# Patient Record
Sex: Male | Born: 1980 | Race: White | Hispanic: No | Marital: Married | Smoking: Never smoker
Health system: Southern US, Community
[De-identification: ages and names within clinical notes are randomized; demographics above are authoritative.]

## PROBLEM LIST (undated history)

## (undated) DIAGNOSIS — K222 Esophageal obstruction: Secondary | ICD-10-CM

## (undated) DIAGNOSIS — Z8619 Personal history of other infectious and parasitic diseases: Secondary | ICD-10-CM

## (undated) DIAGNOSIS — K219 Gastro-esophageal reflux disease without esophagitis: Secondary | ICD-10-CM

## (undated) HISTORY — DX: Gastro-esophageal reflux disease without esophagitis: K21.9

## (undated) HISTORY — DX: Personal history of other infectious and parasitic diseases: Z86.19

## (undated) HISTORY — DX: Esophageal obstruction: K22.2

## (undated) HISTORY — PX: VASECTOMY: SHX75

## (undated) HISTORY — PX: HERNIA REPAIR: SHX51

---

## 2001-03-09 HISTORY — PX: ESOPHAGEAL DILATION: SHX303

## 2008-03-14 ENCOUNTER — Ambulatory Visit: Payer: Self-pay | Admitting: Gastroenterology

## 2010-10-12 ENCOUNTER — Ambulatory Visit: Payer: Self-pay | Admitting: Internal Medicine

## 2011-01-14 ENCOUNTER — Ambulatory Visit: Payer: Self-pay

## 2011-01-16 ENCOUNTER — Ambulatory Visit: Payer: Self-pay

## 2011-08-09 ENCOUNTER — Ambulatory Visit: Payer: Self-pay

## 2011-08-09 LAB — CBC WITH DIFFERENTIAL/PLATELET
Basophil #: 0 10*3/uL (ref 0.0–0.1)
Basophil %: 0.2 %
Eosinophil #: 0 10*3/uL (ref 0.0–0.7)
Eosinophil %: 0.3 %
HGB: 16 g/dL (ref 13.0–18.0)
Lymphocyte #: 0.6 10*3/uL — ABNORMAL LOW (ref 1.0–3.6)
Lymphocyte %: 6.8 %
MCHC: 33.3 g/dL (ref 32.0–36.0)
MCV: 87 fL (ref 80–100)
Monocyte #: 0.7 x10 3/mm (ref 0.2–1.0)
Monocyte %: 7.8 %
Neutrophil %: 84.9 %
RBC: 5.51 10*6/uL (ref 4.40–5.90)

## 2011-08-09 LAB — RAPID STREP-A WITH REFLX: Micro Text Report: NEGATIVE

## 2011-08-09 LAB — MONONUCLEOSIS SCREEN: Mono Test: NEGATIVE

## 2011-08-11 LAB — BETA STREP CULTURE(ARMC)

## 2012-04-28 IMAGING — CT CT ABDOMEN AND PELVIS WITHOUT AND WITH CONTRAST
2 of 4 series · 14 of 32 positions shown, 19 images · non-contrast
Comparison: none

REASON FOR EXAM: right side abd pain with hematuria
COMMENTS:

[Series 5: soft tissue with · axial · 0.78mm/px · z∈[-476,-106]mm · 8 of 96 slices shown, 13 images]
[im 11/96  soft-tissue]
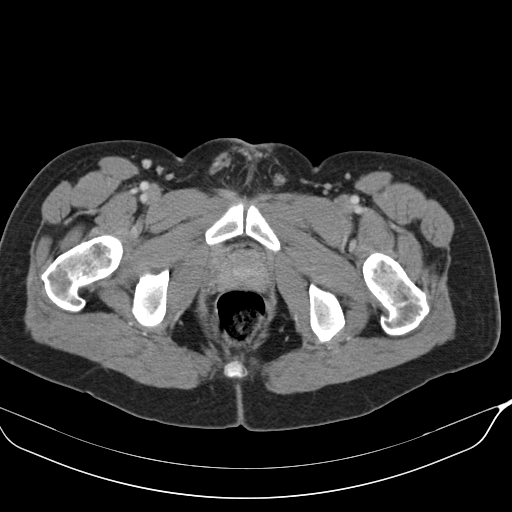
[im 11/96  bone]
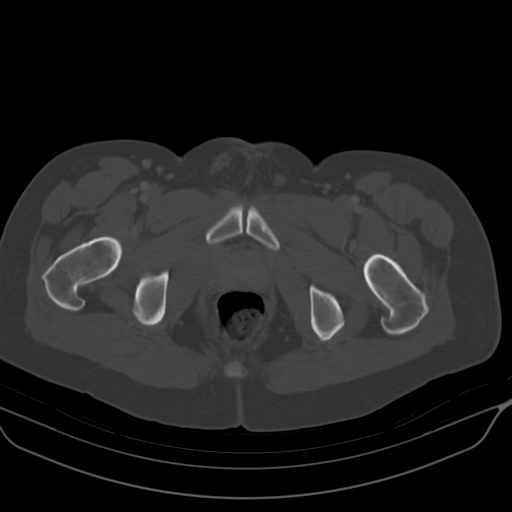
[im 22/96  soft-tissue]
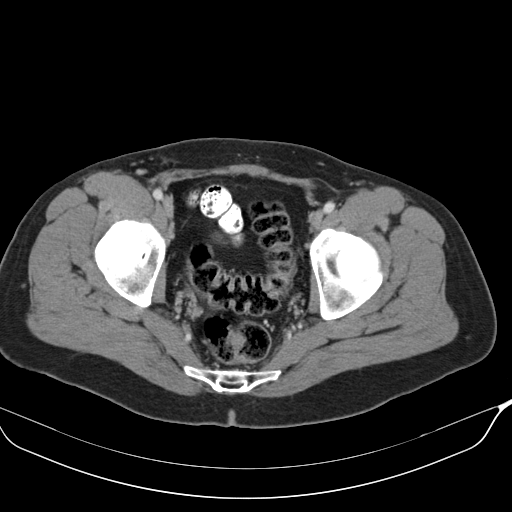
[im 32/96  soft-tissue]
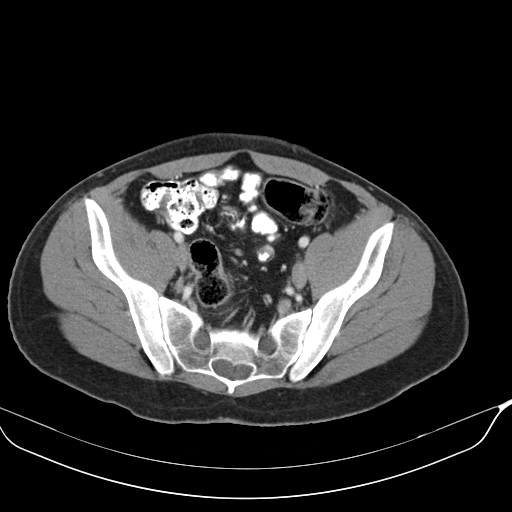
[im 43/96  soft-tissue]
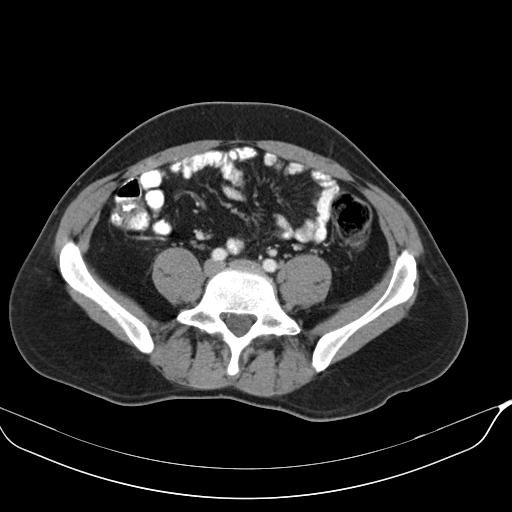
[im 53/96  soft-tissue]
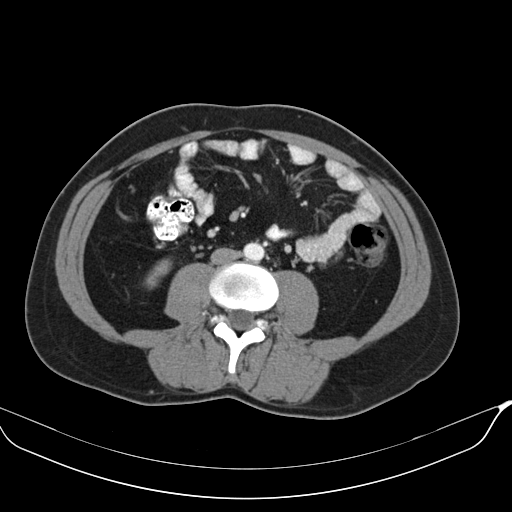
[im 53/96  lung]
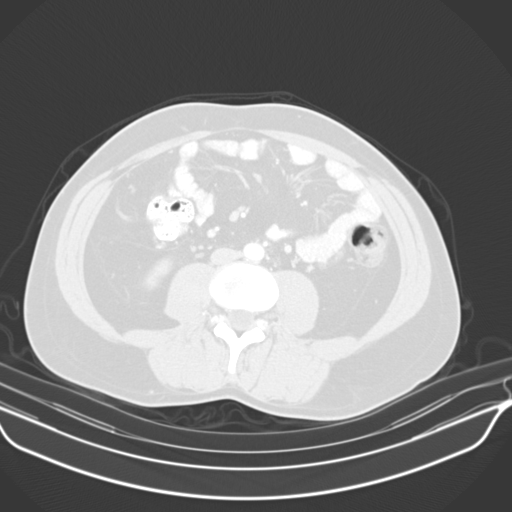
[im 64/96  soft-tissue]
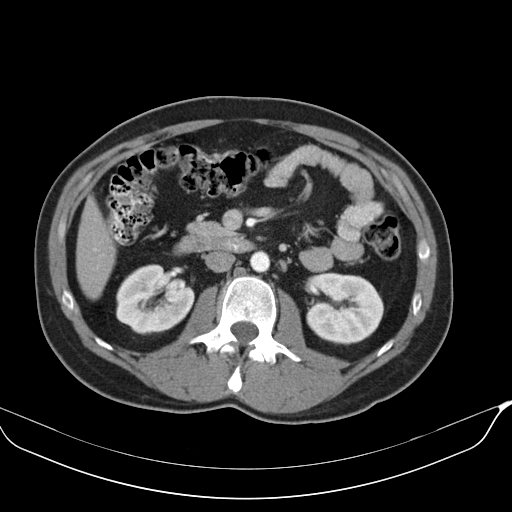
[im 64/96  lung]
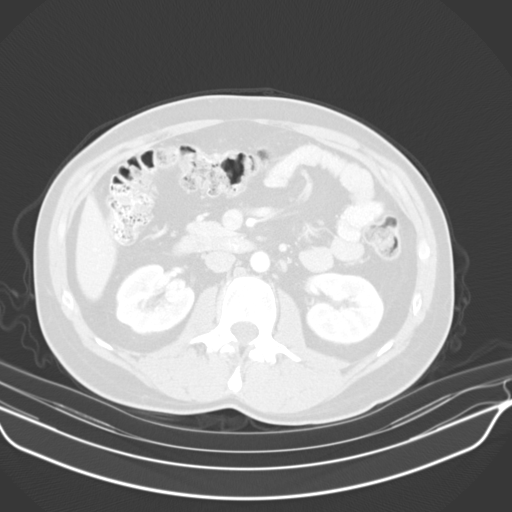
[im 74/96  soft-tissue]
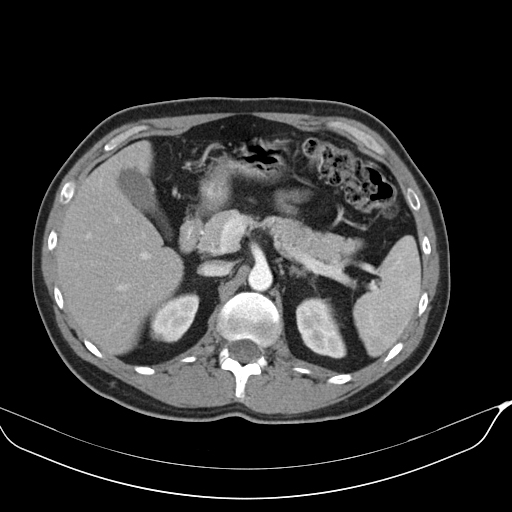
[im 74/96  lung]
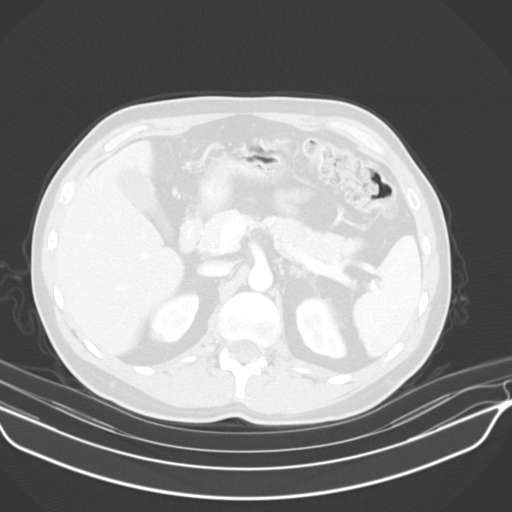
[im 85/96  soft-tissue]
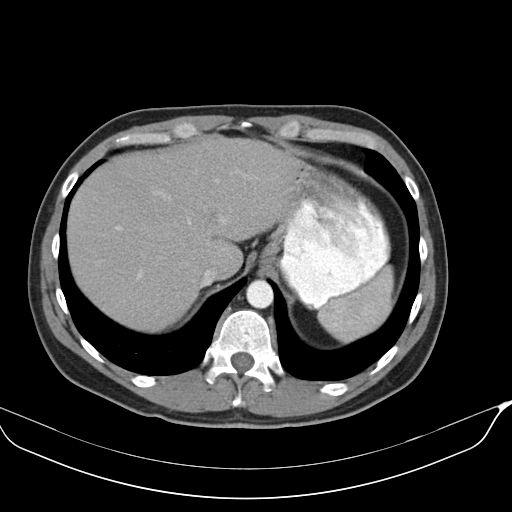
[im 85/96  lung]
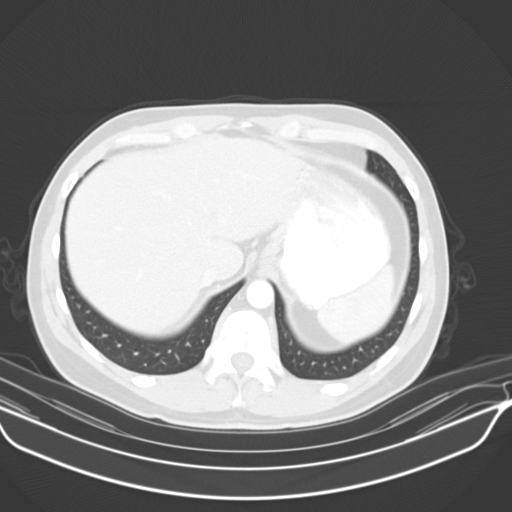

[Series 7: soft tissue delay · axial · delayed · 0.78mm/px · z∈[-476,-211]mm · 6 of 96 slices shown]
[im 11/96  soft-tissue]
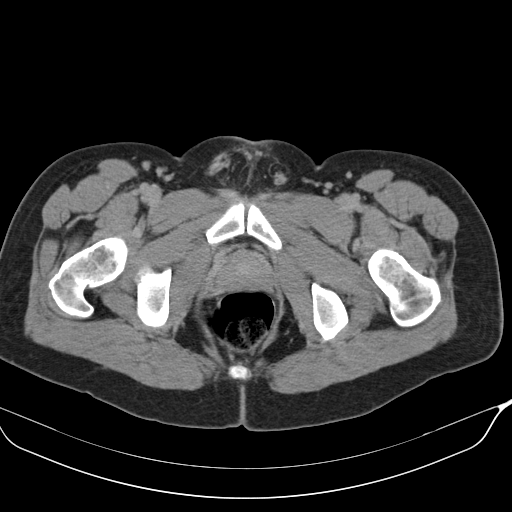
[im 22/96  soft-tissue]
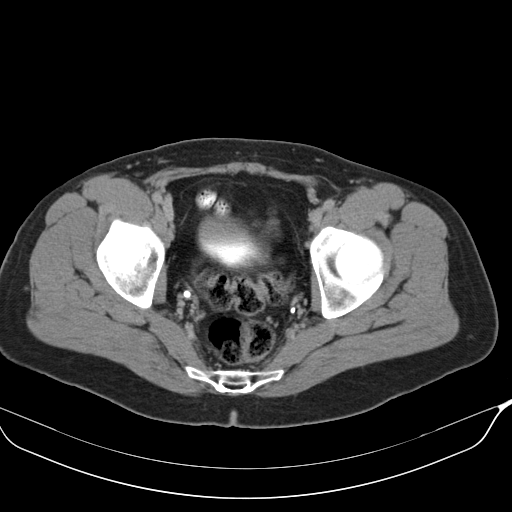
[im 32/96  soft-tissue]
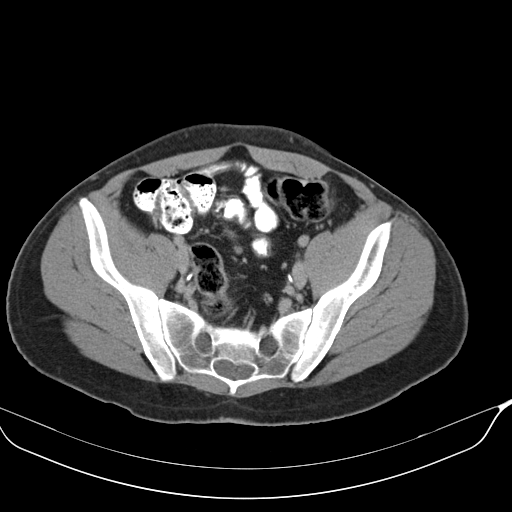
[im 43/96  soft-tissue]
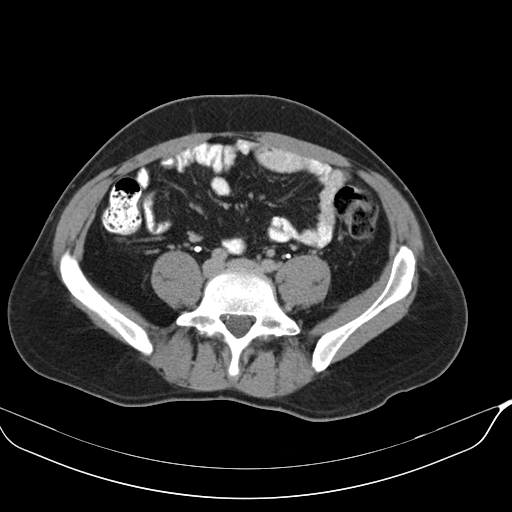
[im 53/96  soft-tissue]
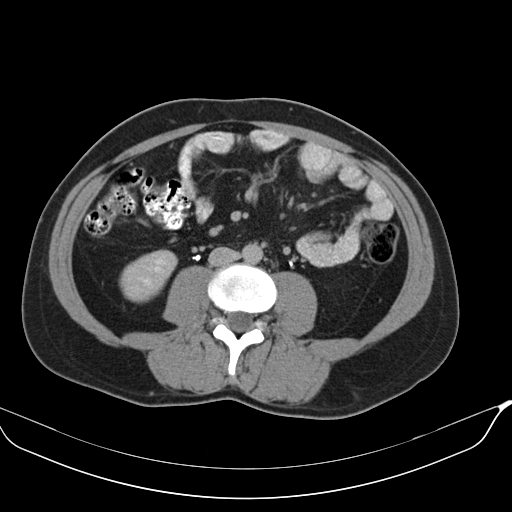
[im 64/96  soft-tissue]
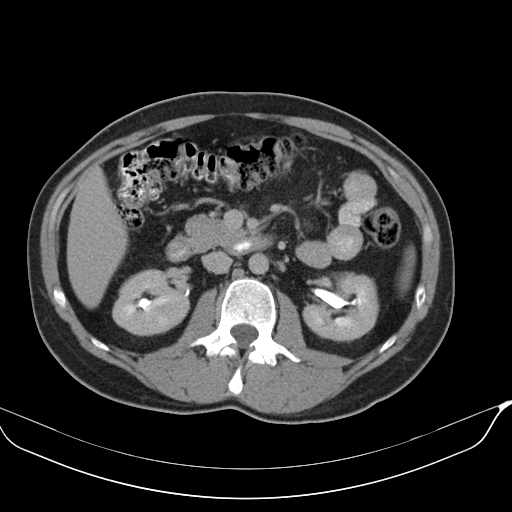

[14 of 32 positions shown; findings below may reference images not displayed]

PROCEDURE:     GUIZENI - GUIZENI ABDOMEN / PELVIS W/WO  - January 16, 2011 [DATE]

RESULT:     Emergent CT of the abdomen and pelvis is reconstructed right
mm slice thickness in the axial plane. Triphasic technique with 100 mL of
Ssovue-3TD iodinated intravenous contrast is utilized. There is no previous
similar study for comparison.

Precontrast images show no hydronephrosis or nephrolithiasis. No radiopaque
gallstones are evident. Following contrast there is no abnormal renal
enhancement. Post contrast delayed images demonstrate excretion of contrast
opacified urine by both kidneys into normal appearing ureters. The urinary
bladder shows no abnormality. The prostate is mildly enlarged. Correlate
with digital rectal exam and PSA. There is no adenopathy. The appendix is
unremarkable. No abnormal bowel distention or wall thickening is
appreciated. The pancreas, liver, spleen, gallbladder, kidneys, adrenal
glands and aorta appear unremarkable.
IMPRESSION: 1. No evidence of nephrolithiasis or ureterolithiasis. No focal renal mass
evident. Mild enlargement of the prostate. Otherwise, unremarkable exam. The
lung bases appear clear.

## 2016-04-01 ENCOUNTER — Ambulatory Visit: Payer: Self-pay | Admitting: Internal Medicine

## 2016-04-07 ENCOUNTER — Ambulatory Visit: Payer: Self-pay | Admitting: Internal Medicine

## 2016-04-16 ENCOUNTER — Ambulatory Visit (INDEPENDENT_AMBULATORY_CARE_PROVIDER_SITE_OTHER): Payer: Self-pay | Admitting: Internal Medicine

## 2016-04-16 ENCOUNTER — Encounter: Payer: Self-pay | Admitting: Internal Medicine

## 2016-04-16 VITALS — BP 124/76 | HR 72 | Temp 98.2°F | Ht 71.75 in | Wt 204.0 lb

## 2016-04-16 DIAGNOSIS — Z23 Encounter for immunization: Secondary | ICD-10-CM

## 2016-04-16 DIAGNOSIS — Z8719 Personal history of other diseases of the digestive system: Secondary | ICD-10-CM

## 2016-04-16 DIAGNOSIS — R030 Elevated blood-pressure reading, without diagnosis of hypertension: Secondary | ICD-10-CM

## 2016-04-16 DIAGNOSIS — K219 Gastro-esophageal reflux disease without esophagitis: Secondary | ICD-10-CM

## 2016-04-16 NOTE — Addendum Note (Signed)
Addended by: Roena MaladyEVONTENNO, Jasmeen Fritsch Y on: 04/16/2016 04:37 PM   Modules accepted: Orders

## 2016-04-16 NOTE — Progress Notes (Signed)
HPI  Pt presents to the clinic today to establish care. He has not had a PCP in many years.   Elevated Blood Pressure: He reports he has never been told he has high blood pressure in the past. His BP today is 130/82. He reports he was rushing to get here because he knew he was late. He denies headaches, dizziness, chest pain or shortness of breath.  GERD with Esophageal Stricture: He has not had any issues with this since his esophageal dilation in 2003.  Flu: 12/2014 Tetanus: 2005 Dentist: annually  Past Medical History:  Diagnosis Date  . History of chicken pox     No current outpatient prescriptions on file.   No current facility-administered medications for this visit.     No Known Allergies  Family History  Problem Relation Age of Onset  . Breast cancer Paternal Grandmother     Social History   Social History  . Marital status: Single    Spouse name: N/A  . Number of children: N/A  . Years of education: N/A   Occupational History  . Not on file.   Social History Main Topics  . Smoking status: Never Smoker  . Smokeless tobacco: Never Used  . Alcohol use Yes     Comment: occasional  . Drug use: No  . Sexual activity: Not on file   Other Topics Concern  . Not on file   Social History Narrative  . No narrative on file    ROS:  Constitutional: Denies fever, malaise, fatigue, headache or abrupt weight changes.  Respiratory: Denies difficulty breathing, shortness of breath, cough or sputum production.   Cardiovascular: Denies chest pain, chest tightness, palpitations or swelling in the hands or feet.  Gastrointestinal: Denies abdominal pain, bloating, constipation, diarrhea or blood in the stool.  Neurological: Denies dizziness, difficulty with memory, difficulty with speech or problems with balance and coordination.  Psych: Denies anxiety, depression, SI/HI.  No other specific complaints in a complete review of systems (except as listed in HPI  above).  PE:  BP 130/82   Pulse 72   Temp 98.2 F (36.8 C) (Oral)   Ht 5' 11.75" (1.822 m)   Wt 204 lb (92.5 kg)   SpO2 98%   BMI 27.86 kg/m  Wt Readings from Last 3 Encounters:  04/16/16 204 lb (92.5 kg)    General: Appears his stated age, well developed, well nourished in NAD. Cardiovascular: Normal rate and rhythm. S1,S2 noted.  No murmur, rubs or gallops noted.  Pulmonary/Chest: Normal effort and positive vesicular breath sounds. No respiratory distress. No wheezes, rales or ronchi noted.  Abdomen: Soft and nontender. Normal bowel sounds. No distention or masses noted. Neurological: Alert and oriented.  Psychiatric: Mood and affect normal. Behavior is normal. Judgment and thought content normal.   Assessment and Plan:  Elevated Blood Pressure:  Recheck 124/76 Will monitor for now  GERD:  Currently not an issue Will monitor  Make an appt for your annual exam Nicki ReaperBAITY, Jarquavious Fentress, NP

## 2016-04-16 NOTE — Patient Instructions (Signed)
Hypertension Hypertension is another name for high blood pressure. High blood pressure forces your heart to work harder to pump blood. A blood pressure reading has two numbers, which includes a higher number over a lower number (example: 110/72). Follow these instructions at home:  Have your blood pressure rechecked by your doctor.  Only take medicine as told by your doctor. Follow the directions carefully. The medicine does not work as well if you skip doses. Skipping doses also puts you at risk for problems.  Do not smoke.  Monitor your blood pressure at home as told by your doctor. Contact a doctor if:  You think you are having a reaction to the medicine you are taking.  You have repeat headaches or feel dizzy.  You have puffiness (swelling) in your ankles.  You have trouble with your vision. Get help right away if:  You get a very bad headache and are confused.  You feel weak, numb, or faint.  You get chest or belly (abdominal) pain.  You throw up (vomit).  You cannot breathe very well. This information is not intended to replace advice given to you by your health care provider. Make sure you discuss any questions you have with your health care provider. Document Released: 08/12/2007 Document Revised: 08/01/2015 Document Reviewed: 12/16/2012 Elsevier Interactive Patient Education  2017 Elsevier Inc.  

## 2016-05-14 ENCOUNTER — Ambulatory Visit (INDEPENDENT_AMBULATORY_CARE_PROVIDER_SITE_OTHER): Payer: Managed Care, Other (non HMO) | Admitting: Internal Medicine

## 2016-05-14 ENCOUNTER — Encounter: Payer: Self-pay | Admitting: Internal Medicine

## 2016-05-14 VITALS — BP 122/80 | HR 74 | Temp 98.2°F | Wt 203.0 lb

## 2016-05-14 DIAGNOSIS — G44209 Tension-type headache, unspecified, not intractable: Secondary | ICD-10-CM | POA: Diagnosis not present

## 2016-05-14 DIAGNOSIS — R030 Elevated blood-pressure reading, without diagnosis of hypertension: Secondary | ICD-10-CM

## 2016-05-14 MED ORDER — METHYLPREDNISOLONE ACETATE 80 MG/ML IJ SUSP
80.0000 mg | Freq: Once | INTRAMUSCULAR | Status: AC
  Administered 2016-05-14: 80 mg via INTRAMUSCULAR

## 2016-05-14 NOTE — Addendum Note (Signed)
Addended by: DEVONTENNO, MELANIE Y on: 05/14/2016 12:02 PM   Modules accepted: Orders  

## 2016-05-14 NOTE — Progress Notes (Signed)
Subjective:    Patient ID: Scott Joyce, male    DOB: 05-Jul-1980, 36 y.o.   MRN: [id number]  HPI  Pt presents to the clinic today with c/o elevated blood pressure. He noticed this 1 week ago. He reports he has been having headaches lately. The pain is located in his forehead/temples. He describes the pain as pressure. He does have some associated muscle tension in his neck.  He denies sensitivity to light, sound, nausea or vomiting. He took his blood pressure at home and it was 135/72. He has been told that he has elevated blood pressure in the past, 130/82 at his last visit. His BP today is 122/80.  Review of Systems  Past Medical History:  Diagnosis Date  . Esophageal stricture   . GERD (gastroesophageal reflux disease)   . History of chicken pox     No current outpatient prescriptions on file.   No current facility-administered medications for this visit.     No Known Allergies  Family History  Problem Relation Age of Onset  . Breast cancer Paternal Grandmother     Social History   Social History  . Marital status: Single    Spouse name: N/A  . Number of children: N/A  . Years of education: N/A   Occupational History  . Not on file.   Social History Main Topics  . Smoking status: Never Smoker  . Smokeless tobacco: Never Used  . Alcohol use Yes     Comment: occasional  . Drug use: No  . Sexual activity: Yes   Other Topics Concern  . Not on file   Social History Narrative  . No narrative on file     Constitutional: Pt reports headache. Denies fever, malaise, fatigue, or abrupt weight changes.  HEENT: Denies eye pain, eye redness, ear pain, ringing in the ears, wax buildup, runny nose, nasal congestion, bloody nose, or sore throat. Respiratory: Denies difficulty breathing, shortness of breath, cough or sputum production.   Cardiovascular: Denies chest pain, chest tightness, palpitations or swelling in the hands or feet.  Neurological: Denies dizziness,  difficulty with memory, difficulty with speech or problems with balance and coordination.    No other specific complaints in a complete review of systems (except as listed in HPI above).     Objective:   Physical Exam   BP 122/80   Pulse 74   Temp 98.2 F (36.8 C) (Oral)   Wt 203 lb (92.1 kg)   SpO2 98%   BMI 27.72 kg/m  Wt Readings from Last 3 Encounters:  05/14/16 203 lb (92.1 kg)  04/16/16 204 lb (92.5 kg)    General: Appears his stated age, well developed, well nourished in NAD. HEENT: Head: normal shape and size; Eyes: sclera white, no icterus, conjunctiva pink, PERRLA and EOMs intact;  Cardiovascular: Normal rate and rhythm. S1,S2 noted.  No murmur, rubs or gallops noted.  Pulmonary/Chest: Normal effort and positive vesicular breath sounds. No respiratory distress. No wheezes, rales or ronchi noted.  Neurological: Alert and oriented.   BMET No results found for: NA, K, CL, CO2, GLUCOSE, BUN, CREATININE, CALCIUM, GFRNONAA, GFRAA  Lipid Panel  No results found for: CHOL, TRIG, HDL, CHOLHDL, VLDL, LDLCALC  CBC    Component Value Date/Time   WBC 9.5 08/09/2011 0935   RBC 5.51 08/09/2011 0935   HGB 16.0 08/09/2011 0935   HCT 48.1 08/09/2011 0935   PLT 169 08/09/2011 0935   MCV 87 08/09/2011 0935   MCH 29.0 08/09/2011  0935   MCHC 33.3 08/09/2011 0935   RDW 13.0 08/09/2011 0935   LYMPHSABS 0.6 (L) 08/09/2011 0935   MONOABS 0.7 08/09/2011 0935   EOSABS 0.0 08/09/2011 0935   BASOSABS 0.0 08/09/2011 0935    Hgb A1C No results found for: HGBA1C      Assessment & Plan:   Elevated Blood Pressure:  Better today No treatment indicated at this time  Tension Headache:  Discussed massage, heating pad and neck exercises 80 mg Depo IM today Can try Ibuprofen 800 mg with Tylenol 1000 mg PO x 1 to see if this knocks it out  RTC as needed or if symptoms persist or worsen BAITY, REGINA, NP

## 2016-05-14 NOTE — Patient Instructions (Signed)

## 2016-12-14 ENCOUNTER — Encounter: Payer: Self-pay | Admitting: Internal Medicine

## 2016-12-23 ENCOUNTER — Ambulatory Visit (INDEPENDENT_AMBULATORY_CARE_PROVIDER_SITE_OTHER): Payer: Managed Care, Other (non HMO) | Admitting: Internal Medicine

## 2016-12-23 ENCOUNTER — Encounter: Payer: Self-pay | Admitting: Internal Medicine

## 2016-12-23 VITALS — BP 124/84 | HR 78 | Temp 98.0°F | Ht 71.75 in | Wt 199.0 lb

## 2016-12-23 DIAGNOSIS — Z23 Encounter for immunization: Secondary | ICD-10-CM

## 2016-12-23 DIAGNOSIS — Z0000 Encounter for general adult medical examination without abnormal findings: Secondary | ICD-10-CM | POA: Diagnosis not present

## 2016-12-23 DIAGNOSIS — G44209 Tension-type headache, unspecified, not intractable: Secondary | ICD-10-CM

## 2016-12-23 MED ORDER — CYCLOBENZAPRINE HCL 5 MG PO TABS: 5.0000 mg | ORAL_TABLET | Freq: Three times a day (TID) | ORAL | 0 refills | Status: DC | PRN

## 2016-12-23 NOTE — Addendum Note (Signed)
Addended by: Roena MaladyEVONTENNO, Quierra Silverio Y on: 12/23/2016 04:32 PM   Modules accepted: Orders

## 2016-12-23 NOTE — Progress Notes (Signed)
Subjective:    Patient ID: Scott Joyce, male    DOB: [birth year], 36 y.o.   MRN: [id number]  HPI  Pt presents to the clinic today for his annual exam.  Flu: 04/2016 Tetanus: 04/2016 Dentist: annually  Diet: He does eat meat. He consumes some fruits and veggies. He does eat fried foods. He drinks mostly water, some Ginger Ale. Exercise: He runs 2-3 days per week.  Review of Systems      Past Medical History:  Diagnosis Date  . Esophageal stricture   . GERD (gastroesophageal reflux disease)   . History of chicken pox     No current outpatient prescriptions on file.   No current facility-administered medications for this visit.     No Known Allergies  Family History  Problem Relation Age of Onset  . Breast cancer Paternal Grandmother     Social History   Social History  . Marital status: Single    Spouse name: N/A  . Number of children: N/A  . Years of education: N/A   Occupational History  . Not on file.   Social History Main Topics  . Smoking status: Never Smoker  . Smokeless tobacco: Never Used  . Alcohol use Yes     Comment: occasional  . Drug use: No  . Sexual activity: Yes   Other Topics Concern  . Not on file   Social History Narrative  . No narrative on file     Constitutional: Pt reports headache. Denies fever, malaise, fatigue, or abrupt weight changes.  HEENT: Denies eye pain, eye redness, ear pain, ringing in the ears, wax buildup, runny nose, nasal congestion, bloody nose, or sore throat. Respiratory: Denies difficulty breathing, shortness of breath, cough or sputum production.   Cardiovascular: Denies chest pain, chest tightness, palpitations or swelling in the hands or feet.  Gastrointestinal: Pt reports reflux. Denies abdominal pain, bloating, constipation, diarrhea or blood in the stool.  GU: Denies urgency, frequency, pain with urination, burning sensation, blood in urine, odor or discharge. Musculoskeletal: Denies decrease in  range of motion, difficulty with gait, muscle pain or joint pain and swelling.  Skin: Denies redness, rashes, lesions or ulcercations.  Neurological: Denies dizziness, difficulty with memory, difficulty with speech or problems with balance and coordination.  Psych: Denies anxiety, depression, SI/HI.  No other specific complaints in a complete review of systems (except as listed in HPI above).  Objective:   Physical Exam  BP 124/84   Pulse 78   Temp 98 F (36.7 C) (Oral)   Ht 5' 11.75" (1.822 m)   Wt 199 lb (90.3 kg)   SpO2 98%   BMI 27.18 kg/m  Wt Readings from Last 3 Encounters:  12/23/16 199 lb (90.3 kg)  05/14/16 203 lb (92.1 kg)  04/16/16 204 lb (92.5 kg)    General: Appears his stated age, well developed, well nourished in NAD. Skin: Warm, dry and intact.  HEENT: Head: normal shape and size; Eyes: sclera white, no icterus, conjunctiva pink, PERRLA and EOMs intact; Ears: Tm's gray and intact, normal light reflex; Throat/Mouth: Teeth present, mucosa pink and moist, no exudate, lesions or ulcerations noted.  Neck:  Neck supple, trachea midline. No masses, lumps or thyromegaly present.  Cardiovascular: Normal rate and rhythm. S1,S2 noted.  No murmur, rubs or gallops noted. No JVD or BLE edema.  Pulmonary/Chest: Normal effort and positive vesicular breath sounds. No respiratory distress. No wheezes, rales or ronchi noted.  Abdomen: Soft and nontender. Normal bowel sounds. No distention  or masses noted. Liver, spleen and kidneys non palpable. Musculoskeletal: Strength 5/5 BUE/BLE. No difficulty with gait.  Neurological: Alert and oriented. Cranial nerves II-XII grossly intact. Coordination normal.  Psychiatric: Mood and affect normal. Behavior is normal. Judgment and thought content normal.       Assessment & Plan:   Preventative Health Maintenance:  Flu shot today Tetanus UTD Encouraged him to consume a balanced diet and exercise regimen Advised him to see a dentist  annually Will check CBC, CMET and lipid profile today  Tension Headache:  Continue Ibuprofen eRx for Flexeril 5 mg QHS prn  RTC in 1 year, sooner if needed Nicki ReaperBAITY, Vyla Pint, NP

## 2016-12-23 NOTE — Patient Instructions (Signed)

## 2016-12-24 LAB — COMPREHENSIVE METABOLIC PANEL
ALT: 23 U/L (ref 0–53)
AST: 20 U/L (ref 0–37)
Albumin: 4.5 g/dL (ref 3.5–5.2)
Alkaline Phosphatase: 60 U/L (ref 39–117)
BUN: 14 mg/dL (ref 6–23)
CALCIUM: 9.6 mg/dL (ref 8.4–10.5)
CO2: 30 mEq/L (ref 19–32)
Chloride: 100 mEq/L (ref 96–112)
Creatinine, Ser: 0.87 mg/dL (ref 0.40–1.50)
GFR: 103.98 mL/min (ref >60.00)
GLUCOSE: 87 mg/dL (ref 70–99)
POTASSIUM: 4.3 meq/L (ref 3.5–5.1)
Sodium: 137 mEq/L (ref 135–145)
Total Bilirubin: 0.5 mg/dL (ref 0.2–1.2)
Total Protein: 7.4 g/dL (ref 6.0–8.3)

## 2016-12-24 LAB — CBC
HCT: 46.1 % (ref 39.0–52.0)
Hemoglobin: 15.3 g/dL (ref 13.0–17.0)
MCHC: 33.2 g/dL (ref 30.0–36.0)
MCV: 86.8 fl (ref 78.0–100.0)
Platelets: 216 10*3/uL (ref 150.0–400.0)
RBC: 5.31 Mil/uL (ref 4.22–5.81)
RDW: 12.7 % (ref 11.5–15.5)
WBC: 8.2 10*3/uL (ref 4.0–10.5)

## 2016-12-24 LAB — LIPID PANEL
Cholesterol: 218 mg/dL — ABNORMAL HIGH (ref 0–200)
HDL: 46.2 mg/dL (ref >39.00)
Total CHOL/HDL Ratio: 5

## 2016-12-24 LAB — LDL CHOLESTEROL, DIRECT: Direct LDL: 117 mg/dL

## 2016-12-29 ENCOUNTER — Telehealth: Payer: Self-pay | Admitting: Internal Medicine

## 2016-12-29 NOTE — Telephone Encounter (Signed)
Copied from CRM #[id number]. Topic: Quick Communication - See Telephone Encounter >> Dec 29, 2016  3:12 PM Terisa Starraylor, Brittany L wrote: CRM for notification. See Telephone encounter for:  12/29/16. Patient missed called for lab results. Please call him again thanks

## 2017-10-10 ENCOUNTER — Encounter: Payer: Self-pay | Admitting: Internal Medicine

## 2017-10-11 ENCOUNTER — Ambulatory Visit: Payer: Managed Care, Other (non HMO) | Admitting: Internal Medicine

## 2017-10-11 ENCOUNTER — Encounter: Payer: Self-pay | Admitting: Internal Medicine

## 2017-10-11 VITALS — BP 124/88 | HR 72 | Temp 98.1°F | Wt 193.0 lb

## 2017-10-11 DIAGNOSIS — S161XXA Strain of muscle, fascia and tendon at neck level, initial encounter: Secondary | ICD-10-CM | POA: Diagnosis not present

## 2017-10-11 MED ORDER — ORPHENADRINE CITRATE ER 100 MG PO TB12: 100.0000 mg | ORAL_TABLET | Freq: Two times a day (BID) | ORAL | 0 refills | Status: DC

## 2017-10-11 NOTE — Patient Instructions (Signed)
Cervical Radiculopathy  Cervical radiculopathy means that a nerve in the neck is pinched or bruised. This can cause pain or loss of feeling (numbness) that runs from your neck to your arm and fingers.  Follow these instructions at home:  Managing pain  ? Take over-the-counter and prescription medicines only as told by your doctor.  ? If directed, put ice on the injured or painful area.  ? Put ice in a plastic bag.  ? Place a towel between your skin and the bag.  ? Leave the ice on for 20 minutes, 2?3 times per day.  ? If ice does not help, you can try using heat. Take a warm shower or warm bath, or use a heat pack as told by your doctor.  ? You may try a gentle neck and shoulder massage.  Activity  ? Rest as needed. Follow instructions from your doctor about any activities to avoid.  ? Do exercises as told by your doctor or physical therapist.  General instructions  ? If you were given a soft collar, wear it as told by your doctor.  ? Use a flat pillow when you sleep.  ? Keep all follow-up visits as told by your doctor. This is important.  Contact a doctor if:  ? Your condition does not improve with treatment.  Get help right away if:  ? Your pain gets worse and is not controlled with medicine.  ? You lose feeling or feel weak in your hand, arm, face, or leg.  ? You have a fever.  ? You have a stiff neck.  ? You cannot control when you poop or pee (have incontinence).  ? You have trouble with walking, balance, or talking.  This information is not intended to replace advice given to you by your health care provider. Make sure you discuss any questions you have with your health care provider.  Document Released: 02/12/2011 Document Revised: 08/01/2015 Document Reviewed: 04/19/2014  Elsevier Interactive Patient Education ? 2018 Elsevier Inc.

## 2017-10-11 NOTE — Progress Notes (Signed)
Subjective:    Patient ID: Scott Joyce, male    DOB: [birth year], 37 y.o.   MRN: [id number]  HPI  Pt presents to the clinic today with c/o neck pain. He reports this started 2 days ago. He describes the pain as sore, tight and achy. The pain radiates down his left arm. He reports associated numbness, tingling but denies weakness. He denies any injury to the area that he is aware of but he did recently start lifting some heavier weights. He has tried Tylenol, Ibuprofen, Aleve, Flexeril, heat and ice with minimal relief.   Review of Systems   Past Medical History:  Diagnosis Date  . Esophageal stricture   . GERD (gastroesophageal reflux disease)   . History of chicken pox     Current Outpatient Medications  Medication Sig Dispense Refill  . cyclobenzaprine (FLEXERIL) 5 MG tablet Take 1 tablet (5 mg total) by mouth 3 (three) times daily as needed for muscle spasms. 20 tablet 0   No current facility-administered medications for this visit.     No Known Allergies  Family History  Problem Relation Age of Onset  . Breast cancer Paternal Grandmother     Social History   Socioeconomic History  . Marital status: Single    Spouse name: Not on file  . Number of children: Not on file  . Years of education: Not on file  . Highest education level: Not on file  Occupational History  . Not on file  Social Needs  . Financial resource strain: Not on file  . Food insecurity:    Worry: Not on file    Inability: Not on file  . Transportation needs:    Medical: Not on file    Non-medical: Not on file  Tobacco Use  . Smoking status: Never Smoker  . Smokeless tobacco: Never Used  Substance and Sexual Activity  . Alcohol use: Yes    Comment: occasional  . Drug use: No  . Sexual activity: Yes  Lifestyle  . Physical activity:    Days per week: Not on file    Minutes per session: Not on file  . Stress: Not on file  Relationships  . Social connections:    Talks on phone: Not on  file    Gets together: Not on file    Attends religious service: Not on file    Active member of club or organization: Not on file    Attends meetings of clubs or organizations: Not on file    Relationship status: Not on file  . Intimate partner violence:    Fear of current or ex partner: Not on file    Emotionally abused: Not on file    Physically abused: Not on file    Forced sexual activity: Not on file  Other Topics Concern  . Not on file  Social History Narrative  . Not on file     Constitutional: Denies fever, malaise, fatigue, headache or abrupt weight changes.  Musculoskeletal: Pt reports neck pain. Denies decrease in range of motion, difficulty with gait, or  joint swelling.  Neurological: Pt reports numbness and tingling in left arm. Denies dizziness, difficulty with memory, difficulty with speech or problems with balance and coordination.    No other specific complaints in a complete review of systems (except as listed in HPI above).     Objective:   Physical Exam   BP 124/88   Pulse 72   Temp 98.1 F (36.7 C) (Oral)  Wt 193 lb (87.5 kg)   SpO2 98%   BMI 26.36 kg/m  Wt Readings from Last 3 Encounters:  10/11/17 193 lb (87.5 kg)  12/23/16 199 lb (90.3 kg)  05/14/16 203 lb (92.1 kg)    General: Appears his stated age, well developed, well nourished in NAD. Musculoskeletal: Normal rflexion, rotation of the cervical spine. Pain with extension. No bony tenderness noted over the cervical spine. Pain with palpation of the left paracervical muscles and left trapezius. Normal internal and external rotation of the left shoulder. No pain with palpation of the left shoulder. Positive drop can on the left. Hand grips equal. Neurological: Alert and oriented. Sensation intact to BLE.   BMET    Component Value Date/Time   NA 137 12/23/2016 1552   K 4.3 12/23/2016 1552   CL 100 12/23/2016 1552   CO2 30 12/23/2016 1552   GLUCOSE 87 12/23/2016 1552   BUN 14  12/23/2016 1552   CREATININE 0.87 12/23/2016 1552   CALCIUM 9.6 12/23/2016 1552    Lipid Panel     Component Value Date/Time   CHOL 218 (H) 12/23/2016 1552   TRIG (H) 12/23/2016 1552    402.0 Triglyceride is over 400; calculations on Lipids are invalid.   HDL 46.20 12/23/2016 1552   CHOLHDL 5 12/23/2016 1552    CBC    Component Value Date/Time   WBC 8.2 12/23/2016 1552   RBC 5.31 12/23/2016 1552   HGB 15.3 12/23/2016 1552   HGB 16.0 08/09/2011 0935   HCT 46.1 12/23/2016 1552   HCT 48.1 08/09/2011 0935   PLT 216.0 12/23/2016 1552   PLT 169 08/09/2011 0935   MCV 86.8 12/23/2016 1552   MCV 87 08/09/2011 0935   MCH 29.0 08/09/2011 0935   MCHC 33.2 12/23/2016 1552   RDW 12.7 12/23/2016 1552   RDW 13.0 08/09/2011 0935   LYMPHSABS 0.6 (L) 08/09/2011 0935   MONOABS 0.7 08/09/2011 0935   EOSABS 0.0 08/09/2011 0935   BASOSABS 0.0 08/09/2011 0935    Hgb A1C No results found for: HGBA1C         Assessment & Plan:   Muscle Strain of Neck:  Encouraged heat 80 mg Depo IM today eRx for Norflex 100 mg BID prn- sedation caution given Stretching exercises given If worse, consider xray cervical spine, left shoulder  Return precautions discussed Nicki Reaperegina Baity, NP

## 2017-10-12 ENCOUNTER — Encounter: Payer: Self-pay | Admitting: Internal Medicine

## 2017-10-12 MED ORDER — METHYLPREDNISOLONE ACETATE 80 MG/ML IJ SUSP
80.0000 mg | Freq: Once | INTRAMUSCULAR | Status: AC
  Administered 2017-10-11: 80 mg via INTRAMUSCULAR

## 2018-11-22 ENCOUNTER — Ambulatory Visit (INDEPENDENT_AMBULATORY_CARE_PROVIDER_SITE_OTHER): Payer: Self-pay

## 2018-11-22 DIAGNOSIS — Z23 Encounter for immunization: Secondary | ICD-10-CM

## 2019-01-02 ENCOUNTER — Other Ambulatory Visit: Payer: Self-pay

## 2019-01-02 ENCOUNTER — Ambulatory Visit (INDEPENDENT_AMBULATORY_CARE_PROVIDER_SITE_OTHER): Payer: PRIVATE HEALTH INSURANCE | Admitting: Internal Medicine

## 2019-01-02 ENCOUNTER — Encounter: Payer: Self-pay | Admitting: Internal Medicine

## 2019-01-02 VITALS — BP 120/82 | HR 84 | Temp 98.7°F | Ht 72.75 in | Wt 203.0 lb

## 2019-01-02 DIAGNOSIS — Z0000 Encounter for general adult medical examination without abnormal findings: Secondary | ICD-10-CM

## 2019-01-02 LAB — CBC
HCT: 47.3 % (ref 39.0–52.0)
Hemoglobin: 15.9 g/dL (ref 13.0–17.0)
MCHC: 33.6 g/dL (ref 30.0–36.0)
MCV: 86.3 fl (ref 78.0–100.0)
Platelets: 217 10*3/uL (ref 150.0–400.0)
RBC: 5.49 Mil/uL (ref 4.22–5.81)
RDW: 13.1 % (ref 11.5–15.5)
WBC: 6 10*3/uL (ref 4.0–10.5)

## 2019-01-02 LAB — COMPREHENSIVE METABOLIC PANEL
ALT: 23 U/L (ref 0–53)
AST: 18 U/L (ref 0–37)
Albumin: 4.5 g/dL (ref 3.5–5.2)
Alkaline Phosphatase: 62 U/L (ref 39–117)
BUN: 11 mg/dL (ref 6–23)
CO2: 29 mEq/L (ref 19–32)
Calcium: 9.2 mg/dL (ref 8.4–10.5)
Chloride: 101 mEq/L (ref 96–112)
Creatinine, Ser: 0.9 mg/dL (ref 0.40–1.50)
GFR: 93.11 mL/min (ref >60.00)
Glucose, Bld: 94 mg/dL (ref 70–99)
Potassium: 4.2 mEq/L (ref 3.5–5.1)
Sodium: 137 mEq/L (ref 135–145)
Total Bilirubin: 0.7 mg/dL (ref 0.2–1.2)
Total Protein: 7.1 g/dL (ref 6.0–8.3)

## 2019-01-02 LAB — LIPID PANEL
Cholesterol: 235 mg/dL — ABNORMAL HIGH (ref 0–200)
HDL: 50.7 mg/dL (ref >39.00)
NonHDL: 184.33
Total CHOL/HDL Ratio: 5
Triglycerides: 218 mg/dL — ABNORMAL HIGH (ref 0.0–149.0)
VLDL: 43.6 mg/dL — ABNORMAL HIGH (ref 0.0–40.0)

## 2019-01-02 LAB — LDL CHOLESTEROL, DIRECT: Direct LDL: 140 mg/dL

## 2019-01-02 NOTE — Patient Instructions (Signed)

## 2019-01-02 NOTE — Progress Notes (Signed)
Subjective:    Patient ID: Scott Joyce, male    DOB: 03/12/1980, 38 y.o.   MRN: [id number]  HPI  Pt presents to the clinic today for his annual exam.  Flu: 11/2018 Tetanus: 12/2016 Vision Screening: annually Dentist: annually, PRN  Diet: He does eat meat. He consumes some fruits and veggies. He does eat some fried foods. He drinks mostly soda and water. Exercise:  Walking  Review of Systems      Past Medical History:  Diagnosis Date  . Esophageal stricture   . GERD (gastroesophageal reflux disease)   . History of chicken pox     Current Outpatient Medications  Medication Sig Dispense Refill  . cyclobenzaprine (FLEXERIL) 5 MG tablet Take 1 tablet (5 mg total) by mouth 3 (three) times daily as needed for muscle spasms. 20 tablet 0  . orphenadrine (NORFLEX) 100 MG tablet Take 1 tablet (100 mg total) by mouth 2 (two) times daily. 20 tablet 0   No current facility-administered medications for this visit.     No Known Allergies  Family History  Problem Relation Age of Onset  . Breast cancer Paternal Grandmother     Social History   Socioeconomic History  . Marital status: Single    Spouse name: Not on file  . Number of children: Not on file  . Years of education: Not on file  . Highest education level: Not on file  Occupational History  . Not on file  Social Needs  . Financial resource strain: Not on file  . Food insecurity    Worry: Not on file    Inability: Not on file  . Transportation needs    Medical: Not on file    Non-medical: Not on file  Tobacco Use  . Smoking status: Never Smoker  . Smokeless tobacco: Never Used  Substance and Sexual Activity  . Alcohol use: Yes    Comment: occasional  . Drug use: No  . Sexual activity: Yes  Lifestyle  . Physical activity    Days per week: Not on file    Minutes per session: Not on file  . Stress: Not on file  Relationships  . Social Musician on phone: Not on file    Gets together: Not on  file    Attends religious service: Not on file    Active member of club or organization: Not on file    Attends meetings of clubs or organizations: Not on file    Relationship status: Not on file  . Intimate partner violence    Fear of current or ex partner: Not on file    Emotionally abused: Not on file    Physically abused: Not on file    Forced sexual activity: Not on file  Other Topics Concern  . Not on file  Social History Narrative  . Not on file     Constitutional: Denies fever, malaise, fatigue, headache or abrupt weight changes.  HEENT: Denies eye pain, eye redness, ear pain, ringing in the ears, wax buildup, runny nose, nasal congestion, bloody nose, or sore throat. Respiratory: Denies difficulty breathing, shortness of breath, cough or sputum production.   Cardiovascular: Denies chest pain, chest tightness, palpitations or swelling in the hands or feet.  Gastrointestinal: Pt reports intermittent reflux. Denies abdominal pain, bloating, constipation, diarrhea or blood in the stool.  GU: Denies urgency, frequency, pain with urination, burning sensation, blood in urine, odor or discharge. Musculoskeletal: Pt reports intermittent neck pain. Denies decrease  in range of motion, difficulty with gait, or joint swelling.  Skin: Denies redness, rashes, lesions or ulcercations.  Neurological: Denies dizziness, difficulty with memory, difficulty with speech or problems with balance and coordination.  Psych: Denies anxiety, depression, SI/HI.  No other specific complaints in a complete review of systems (except as listed in HPI above).  Objective:   Physical Exam   BP 120/82   Pulse 84   Temp 98.7 F (37.1 C) (Temporal)   Ht 6' 0.75" (1.848 m)   Wt 203 lb (92.1 kg)   SpO2 98%   BMI 26.97 kg/m  Wt Readings from Last 3 Encounters:  01/02/19 203 lb (92.1 kg)  10/11/17 193 lb (87.5 kg)  12/23/16 199 lb (90.3 kg)    General: Appears his stated age, well developed, well  nourished in NAD. Skin: Warm, dry and intact. No rashes noted. HEENT: Head: normal shape and size; Eyes: sclera white, no icterus, conjunctiva pink, PERRLA and EOMs intact; Ears: Tm's gray and intact, normal light reflex;  Neck:  Neck supple, trachea midline. No masses, lumps or thyromegaly present.  Cardiovascular: Normal rate and rhythm. S1,S2 noted.  No murmur, rubs or gallops noted. No JVD or BLE edema.  Pulmonary/Chest: Normal effort and positive vesicular breath sounds. No respiratory distress. No wheezes, rales or ronchi noted.  Abdomen: Soft and nontender. Normal bowel sounds. No distention or masses noted. Liver, spleen and kidneys non palpable. Musculoskeletal: Normal flexion, extension and rotation of the cervical spine. No bony tenderness noted over the cervical spine. Strength 5/5 BUE/BLE. No difficulty with gait.  Neurological: Alert and oriented. Cranial nerves II-XII grossly intact. Coordination normal.  Psychiatric: Mood and affect normal. Behavior is normal. Judgment and thought content normal.     BMET    Component Value Date/Time   NA 137 12/23/2016 1552   K 4.3 12/23/2016 1552   CL 100 12/23/2016 1552   CO2 30 12/23/2016 1552   GLUCOSE 87 12/23/2016 1552   BUN 14 12/23/2016 1552   CREATININE 0.87 12/23/2016 1552   CALCIUM 9.6 12/23/2016 1552    Lipid Panel     Component Value Date/Time   CHOL 218 (H) 12/23/2016 1552   TRIG (H) 12/23/2016 1552    402.0 Triglyceride is over 400; calculations on Lipids are invalid.   HDL 46.20 12/23/2016 1552   CHOLHDL 5 12/23/2016 1552    CBC    Component Value Date/Time   WBC 8.2 12/23/2016 1552   RBC 5.31 12/23/2016 1552   HGB 15.3 12/23/2016 1552   HGB 16.0 08/09/2011 0935   HCT 46.1 12/23/2016 1552   HCT 48.1 08/09/2011 0935   PLT 216.0 12/23/2016 1552   PLT 169 08/09/2011 0935   MCV 86.8 12/23/2016 1552   MCV 87 08/09/2011 0935   MCH 29.0 08/09/2011 0935   MCHC 33.2 12/23/2016 1552   RDW 12.7 12/23/2016 1552    RDW 13.0 08/09/2011 0935   LYMPHSABS 0.6 (L) 08/09/2011 0935   MONOABS 0.7 08/09/2011 0935   EOSABS 0.0 08/09/2011 0935   BASOSABS 0.0 08/09/2011 0935    Hgb A1C No results found for: HGBA1C         Assessment & Plan:   Preventative Health Maintenance:  Flu shot UTD Tetanus UTD Encouraged him to consume a balanced diet and exercise reginmen Advised him to see an eye doctor and dentist annually Will check CBC, CMET and Lipid profile today  RTC in 1 year, sooner if needed Webb Silversmith, NP

## 2019-01-11 ENCOUNTER — Encounter: Payer: Self-pay | Admitting: Internal Medicine

## 2019-01-13 MED ORDER — ATORVASTATIN CALCIUM 10 MG PO TABS: 10.0000 mg | ORAL_TABLET | Freq: Every day | ORAL | 2 refills | Status: DC

## 2019-02-01 ENCOUNTER — Encounter: Payer: Self-pay | Admitting: Internal Medicine

## 2019-02-01 ENCOUNTER — Telehealth: Payer: Self-pay

## 2019-02-01 NOTE — Telephone Encounter (Signed)
Called number in chart, no answer and the name on VM was "Scott Joyce" so did not lmovm  Pt needs to schedule an UC f/u and need to know where he was seen to request for records, pt is requesting for Cardiology referral...   mychart msg sent to pt 4:15 slot blocked for today if no answer from pt by 12pm will release slot for UC f/u 30 min slot needed

## 2019-02-06 ENCOUNTER — Other Ambulatory Visit: Payer: Self-pay | Admitting: Internal Medicine

## 2019-02-06 ENCOUNTER — Encounter: Payer: Self-pay | Admitting: Internal Medicine

## 2019-02-06 ENCOUNTER — Ambulatory Visit: Payer: PRIVATE HEALTH INSURANCE | Admitting: Internal Medicine

## 2019-02-06 ENCOUNTER — Other Ambulatory Visit: Payer: Self-pay

## 2019-02-06 VITALS — BP 124/84 | HR 72 | Temp 98.3°F | Wt 200.0 lb

## 2019-02-06 DIAGNOSIS — R002 Palpitations: Secondary | ICD-10-CM | POA: Diagnosis not present

## 2019-02-06 DIAGNOSIS — R42 Dizziness and giddiness: Secondary | ICD-10-CM

## 2019-02-06 MED ORDER — ALPRAZOLAM 0.25 MG PO TABS: 0.2500 mg | ORAL_TABLET | Freq: Two times a day (BID) | ORAL | 0 refills | Status: DC | PRN

## 2019-02-06 NOTE — Progress Notes (Signed)
Subjective:    Patient ID: Scott Joyce, male    DOB: 05-Oct-1980, 38 y.o.   MRN: [id number]  HPI  Pt presents to the clinic today for urgent care follow up. Pt reports he went to [facility] urgent care on 01/30/2009 because he was getting dizzy at work. He reports having episodes of dizziness (light headedness), really dry mouth (no matter if he drank 3 bottles of water), racing heart, sweaty hands, hot flashes and shaky hands. He reports having 4-5 episodes since last Tuesday. During the most recent episode, he mentions he felt feeling light-headed right before a meeting at work when everyone was standing around. He reports these episodes last for a few minutes and has loss of focus after episodes. While at the urgent care, the doctor did an EKG and recomended he follow up with a cardiologist. He reports his grandfather passed away from heart and other health issues at the age of 36. He reports his sister has anxiety and panic attacks. He denies chest pain or SOB but reports his stomach feels "queezy" and has mild nausea during episodes. He denies recent changes at work or home and denies mood changes or increased stress.    Review of Systems  Past Medical History:  Diagnosis Date  . Esophageal stricture   . GERD (gastroesophageal reflux disease)   . History of chicken pox     Current Outpatient Medications  Medication Sig Dispense Refill  . atorvastatin (LIPITOR) 10 MG tablet Take 1 tablet (10 mg total) by mouth daily. 30 tablet 2   No current facility-administered medications for this visit.     No Known Allergies  Family History  Problem Relation Age of Onset  . Breast cancer Paternal Grandmother     Social History   Socioeconomic History  . Marital status: Single    Spouse name: Not on file  . Number of children: Not on file  . Years of education: Not on file  . Highest education level: Not on file  Occupational History  . Not on file  Social Needs  . Financial  resource strain: Not on file  . Food insecurity    Worry: Not on file    Inability: Not on file  . Transportation needs    Medical: Not on file    Non-medical: Not on file  Tobacco Use  . Smoking status: Never Smoker  . Smokeless tobacco: Never Used  Substance and Sexual Activity  . Alcohol use: Yes    Comment: occasional  . Drug use: No  . Sexual activity: Yes  Lifestyle  . Physical activity    Days per week: Not on file    Minutes per session: Not on file  . Stress: Not on file  Relationships  . Social Herbalist on phone: Not on file    Gets together: Not on file    Attends religious service: Not on file    Active member of club or organization: Not on file    Attends meetings of clubs or organizations: Not on file    Relationship status: Not on file  . Intimate partner violence    Fear of current or ex partner: Not on file    Emotionally abused: Not on file    Physically abused: Not on file    Forced sexual activity: Not on file  Other Topics Concern  . Not on file  Social History Narrative  . Not on file     Constitutional: Denies  fever, malaise, fatigue, headache or abrupt weight changes.  Respiratory: Denies difficulty breathing, shortness of breath, cough or sputum production.   Cardiovascular: Pt reports palpitations. Denies chest pain, chest tightness or swelling in the hands or feet.  Gastrointestinal: Pt reports nausea. Denies abdominal pain, bloating, constipation, diarrhea or blood in the stool.  Musculoskeletal: Denies decrease in range of motion, difficulty with gait, muscle pain or joint pain and swelling.  Skin: Denies redness, rashes, lesions or ulcercations.  Neurological: Pt reports light-headedness and loss of focus. Psych: Pt reports anxiety. Denies depression, SI/HI.  No other specific complaints in a complete review of systems (except as listed in HPI above).     Objective:   Physical Exam  BP 124/84   Pulse 72   Temp 98.3 F  (36.8 C) (Temporal)   Wt 200 lb (90.7 kg)   SpO2 98%   BMI 26.57 kg/m   Wt Readings from Last 3 Encounters:  01/02/19 203 lb (92.1 kg)  10/11/17 193 lb (87.5 kg)  12/23/16 199 lb (90.3 kg)    General: Appears stated age, well developed in NAD.  Neck:  Neck supple, trachea midline. No masses, lumps or thyromegaly present.  Cardiovascular: Normal rate and rhythm. S1,S2 noted.  No murmur, rubs or gallops noted.  Pulmonary/Chest: Normal effort and positive vesicular breath sounds. No respiratory distress. No wheezes, rales or ronchi noted.  Abdomen: Soft and nontender. Normal bowel sounds. No distention or masses noted.   Neurological: Alert and oriented. Coordination normal.  Psychiatric: Appears anxious, shaking his legs. Judgment and thought content normal.    BMET    Component Value Date/Time   NA 137 01/02/2019 1159   K 4.2 01/02/2019 1159   CL 101 01/02/2019 1159   CO2 29 01/02/2019 1159   GLUCOSE 94 01/02/2019 1159   BUN 11 01/02/2019 1159   CREATININE 0.90 01/02/2019 1159   CALCIUM 9.2 01/02/2019 1159    Lipid Panel     Component Value Date/Time   CHOL 235 (H) 01/02/2019 1159   TRIG 218.0 (H) 01/02/2019 1159   HDL 50.70 01/02/2019 1159   CHOLHDL 5 01/02/2019 1159   VLDL 43.6 (H) 01/02/2019 1159    CBC    Component Value Date/Time   WBC 6.0 01/02/2019 1159   RBC 5.49 01/02/2019 1159   HGB 15.9 01/02/2019 1159   HGB 16.0 08/09/2011 0935   HCT 47.3 01/02/2019 1159   HCT 48.1 08/09/2011 0935   PLT 217.0 01/02/2019 1159   PLT 169 08/09/2011 0935   MCV 86.3 01/02/2019 1159   MCV 87 08/09/2011 0935   MCH 29.0 08/09/2011 0935   MCHC 33.6 01/02/2019 1159   RDW 13.1 01/02/2019 1159   RDW 13.0 08/09/2011 0935   LYMPHSABS 0.6 (L) 08/09/2011 0935   MONOABS 0.7 08/09/2011 0935   EOSABS 0.0 08/09/2011 0935   BASOSABS 0.0 08/09/2011 0935    Hgb A1C No results found for: HGBA1C          Assessment & Plan:  Palpitations, Lightheadedness:  DDX:  paroxysmal afib/aflutter, hypothyroidism, vitamin deficiency, anxiety EKG and documentation from urgent care visit reviewed Will order TSH, Vit B12, Vit D and Magnesium Will refer to cardiology for further workup (Holter Monitor)  Will follow up after labs are back, return precautions discussed Nicki Reaper, NP This visit occurred during the SARS-CoV-2 public health emergency.  Safety protocols were in place, including screening questions prior to the visit, additional usage of staff PPE, and extensive cleaning of exam room while  observing appropriate contact time as indicated for disinfecting solutions.

## 2019-02-07 ENCOUNTER — Encounter: Payer: Self-pay | Admitting: Internal Medicine

## 2019-02-07 LAB — VITAMIN B12: Vitamin B-12: 158 pg/mL — ABNORMAL LOW (ref 211–911)

## 2019-02-07 LAB — VITAMIN D 25 HYDROXY (VIT D DEFICIENCY, FRACTURES): VITD: 23.26 ng/mL — ABNORMAL LOW (ref 30.00–100.00)

## 2019-02-07 LAB — MAGNESIUM: Magnesium: 2.2 mg/dL (ref 1.5–2.5)

## 2019-02-07 LAB — TSH: TSH: 0.99 u[IU]/mL (ref 0.35–4.50)

## 2019-02-07 NOTE — Patient Instructions (Signed)

## 2019-02-08 ENCOUNTER — Encounter: Payer: Self-pay | Admitting: Internal Medicine

## 2019-02-16 ENCOUNTER — Encounter: Payer: Self-pay | Admitting: Internal Medicine

## 2019-02-16 MED ORDER — ATORVASTATIN CALCIUM 10 MG PO TABS: 10.0000 mg | ORAL_TABLET | Freq: Every day | ORAL | 1 refills | Status: DC

## 2019-02-17 DIAGNOSIS — I479 Paroxysmal tachycardia, unspecified: Secondary | ICD-10-CM | POA: Insufficient documentation

## 2019-02-17 DIAGNOSIS — R42 Dizziness and giddiness: Secondary | ICD-10-CM | POA: Insufficient documentation

## 2019-02-17 NOTE — Progress Notes (Signed)
Cardiology Office Note  Date:  02/20/2019   ID:  Scott Joyce, DOB 1981/01/17, MRN [id number]  PCP:  Scott Fenton, NP   Chief Complaint  Patient presents with  . OTHER    C/o palpitaions, lightheaded, pressure neck and temple. Meds reviewed verbally with pt.. Meds reviewed verbally with pt.    HPI:  [name] is a 38 year old gentleman  with prior cardiac history Referred by Scott Joyce for consultation of his palpitations, lightheadedness  He reports that on January 31, 2019 he was at work He had skipped breakfast, was standing at a meeting He started feeling lightheaded, had dry mouth, then developed tachycardia, felt shaky, hand sweating  At baseline feels well, works at a [place] to walk long distances without any difficulty Denies chest pain or shortness of breath with exertion  Does not feel that he has paroxysmal tachycardia  He went to urgent care after his episode as detailed above EKG reviewed personally by myself on today's visit, normal sinus rhythm no acute abnormality  Since then reports having rare episodes of dizziness, Sometimes in sitting or standing position Does not feel his heart was racing  Also reports having some tension headaches, tightness in his neck  Orthostatics checked in the office today, no significant change with standing 130s over 80s, heart rate stayed in the 70s  EKG personally reviewed by myself on todays visit Shows normal sinus rhythm with rate 70 bpm no significant ST or T wave changes  Primary care notes reviewed, Reports sister has anxiety and panic attacks   PMH:   has a past medical history of Esophageal stricture, GERD (gastroesophageal reflux disease), and History of chicken pox.  PSH:    Past Surgical History:  Procedure Laterality Date  . ESOPHAGEAL DILATION  2003  . HERNIA REPAIR    . VASECTOMY      Current Outpatient Medications  Medication Sig Dispense Refill  . ALPRAZolam (XANAX) 0.25 MG  tablet Take 1 tablet (0.25 mg total) by mouth 2 (two) times daily as needed for anxiety. 10 tablet 0  . atorvastatin (LIPITOR) 10 MG tablet Take 1 tablet (10 mg total) by mouth daily. 30 tablet 1  . Cyanocobalamin (B-12 PO) Take by mouth daily.    Marland Kitchen VITAMIN D PO Take by mouth daily.     No current facility-administered medications for this visit.     Allergies:   Patient has no known allergies.   Social History:  The patient  reports that he has never smoked. He has never used smokeless tobacco. He reports current alcohol use. He reports that he does not use drugs.   Family History:   family history includes Breast cancer in his paternal grandmother.    Review of Systems: Review of Systems  Constitutional: Negative.   HENT: Negative.   Respiratory: Negative.   Cardiovascular: Negative.   Gastrointestinal: Negative.   Musculoskeletal: Negative.   Neurological: Positive for dizziness.  Psychiatric/Behavioral: Negative.   All other systems reviewed and are negative.   PHYSICAL EXAM: VS:  BP (!) 133/93 (BP Location: Right Arm, Patient Position: Sitting, Cuff Size: Normal)   Pulse 70   Ht 6' (1.829 m)   Wt 201 lb 4 oz (91.3 kg)   SpO2 98%   BMI 27.29 kg/m  , BMI Body mass index is 27.29 kg/m. GEN: Well nourished, well developed, in no acute distress HEENT: normal Neck: no JVD, carotid bruits, or masses Cardiac: RRR; no murmurs, rubs, or gallops,no edema  Respiratory:  clear to auscultation bilaterally, normal work of breathing GI: soft, nontender, nondistended, + BS MS: no deformity or atrophy Skin: warm and dry, no rash Neuro:  Strength and sensation are intact Psych: euthymic mood, full affect   Recent Labs: 01/02/2019: ALT 23; BUN 11; Creatinine, Ser 0.90; Hemoglobin 15.9; Platelets 217.0; Potassium 4.2; Sodium 137 02/06/2019: Magnesium 2.2; TSH 0.99    Lipid Panel Lab Results  Component Value Date   CHOL 235 (H) 01/02/2019   HDL 50.70 01/02/2019   TRIG 218.0  (H) 01/02/2019      Wt Readings from Last 3 Encounters:  02/20/19 201 lb 4 oz (91.3 kg)  02/06/19 200 lb (90.7 kg)  01/02/19 203 lb (92.1 kg)       ASSESSMENT AND PLAN:  Problem List Items Addressed This Visit      Cardiology Problems   Paroxysmal tachycardia (HCC)     Other   Lightheadedness     Dizziness Symptoms concerning for anxiety attack though unable to exclude drop in blood pressure Recommend he eat breakfast, have something to drink on the way to work Morning of his episode had not had anything to eat or drink Suggested he try not to skip meals even if he ate a protein bar or 2 with something to drink -Discussed monitoring heart rate at home using pulse meter on his phone If there is concern for paroxysmal tachycardia a ZIO monitor could be ordered.  Details of the monitor were discussed with him   Hyperlipidemia Discussed screening studies for risk stratification, Information provided on CT coronary calcium scoring He can call our office if he would like to be scanned Recently started on cholesterol medication   Disposition:   F/U as needed   Total encounter time more than 45 minutes  Greater than 50% was spent in counseling and coordination of care with the patient    Signed, Dossie Arbour, M.D., Ph.D. [facility] [place], [place] [phone]

## 2019-02-20 ENCOUNTER — Ambulatory Visit (INDEPENDENT_AMBULATORY_CARE_PROVIDER_SITE_OTHER): Payer: PRIVATE HEALTH INSURANCE | Admitting: Cardiovascular Disease

## 2019-02-20 ENCOUNTER — Encounter: Payer: Self-pay | Admitting: Cardiovascular Disease

## 2019-02-20 ENCOUNTER — Other Ambulatory Visit: Payer: Self-pay

## 2019-02-20 DIAGNOSIS — I479 Paroxysmal tachycardia, unspecified: Secondary | ICD-10-CM

## 2019-02-20 DIAGNOSIS — R42 Dizziness and giddiness: Secondary | ICD-10-CM

## 2019-02-20 NOTE — Patient Instructions (Signed)
Read about CT coronary calcium score, $150 Call if you would like to order   Medication Instructions:  No changes  If you need a refill on your cardiac medications before your next appointment, please call your pharmacy.    Lab work: No new labs needed   If you have labs (blood work) drawn today and your tests are completely normal, you will receive your results only by: Marland Kitchen MyChart Message (if you have MyChart) OR . A paper copy in the mail If you have any lab test that is abnormal or we need to change your treatment, we will call you to review the results.   Testing/Procedures: No new testing needed   Follow-Up: At [facility], you and your health needs are our priority.  As part of our continuing mission to provide you with exceptional heart care, we have created designated Provider Care Teams.  These Care Teams include your primary Cardiologist (physician) and Advanced Practice Providers (APPs -  Physician Assistants and Nurse Practitioners) who all work together to provide you with the care you need, when you need it.  . You will need a follow up appointment as needed   . Providers on your designated Care Team:   . Murray Hodgkins, NP . Christell Faith, PA-C . Marrianne Mood, PA-C  Any Other Special Instructions Will Be Listed Below (If Applicable).  For educational health videos Log in to : [email or web address] Or : [email or web address], password : [email or web address]

## 2019-03-10 ENCOUNTER — Other Ambulatory Visit: Payer: Self-pay | Admitting: Internal Medicine

## 2019-03-17 ENCOUNTER — Other Ambulatory Visit: Payer: Self-pay | Admitting: Internal Medicine

## 2019-03-17 NOTE — Telephone Encounter (Signed)
Is he continuing to have issues with anxiety. How often is he having anxiety symptoms.

## 2019-03-20 MED ORDER — ALPRAZOLAM 0.25 MG PO TABS: 0.2500 mg | ORAL_TABLET | Freq: Every day | ORAL | 0 refills | Status: DC | PRN

## 2019-04-08 ENCOUNTER — Other Ambulatory Visit: Payer: Self-pay | Admitting: Internal Medicine

## 2019-04-14 ENCOUNTER — Other Ambulatory Visit: Payer: Self-pay | Admitting: Internal Medicine

## 2019-04-14 NOTE — Telephone Encounter (Signed)
Name of Medication: Alprazolam 0.25 mg Name of Pharmacy: [facility] [address] Last [place] or Written Date and Quantity: # 10 on 03/20/19 Last Office Visit and Type: 02/06/2019 FU Next Office Visit and Type:01/05/20 CPX

## 2019-04-18 MED ORDER — ALPRAZOLAM 0.25 MG PO TABS: 0.2500 mg | ORAL_TABLET | Freq: Every day | ORAL | 0 refills | Status: DC | PRN

## 2019-05-03 ENCOUNTER — Other Ambulatory Visit: Payer: Self-pay | Admitting: Internal Medicine

## 2019-05-17 ENCOUNTER — Other Ambulatory Visit: Payer: Self-pay | Admitting: Internal Medicine

## 2019-05-18 ENCOUNTER — Other Ambulatory Visit: Payer: Self-pay | Admitting: Internal Medicine

## 2019-05-18 MED ORDER — ALPRAZOLAM 0.25 MG PO TABS: 0.2500 mg | ORAL_TABLET | Freq: Every day | ORAL | 0 refills | Status: DC | PRN

## 2019-05-18 NOTE — Telephone Encounter (Signed)
Last filled 04/18/2019.... please advise  

## 2019-06-04 ENCOUNTER — Ambulatory Visit: Payer: PRIVATE HEALTH INSURANCE | Attending: Internal Medicine

## 2019-06-04 DIAGNOSIS — Z23 Encounter for immunization: Secondary | ICD-10-CM

## 2019-06-04 NOTE — Progress Notes (Signed)
   Covid-19 Vaccination Clinic  Name:  Scott Joyce    MRN: [id number] DOB: Aug 04, 1980  06/04/2019  Scott Joyce was observed post Covid-19 immunization for 15 minutes without incident. He was provided with Vaccine Information Sheet and instruction to access the V-Safe system.   Scott Joyce was instructed to call 911 with any severe reactions post vaccine: Marland Kitchen Difficulty breathing  . Swelling of face and throat  . A fast heartbeat  . A bad rash all over body  . Dizziness and weakness   Immunizations Administered    Name Date Dose VIS Date Route   Pfizer COVID-19 Vaccine 06/04/2019  8:48 AM 0.3 mL 02/17/2019 Intramuscular   Manufacturer: [employer], [employer]   Lot: [id number]   NDC: [address]-9

## 2019-06-28 ENCOUNTER — Ambulatory Visit: Payer: PRIVATE HEALTH INSURANCE | Attending: Internal Medicine

## 2019-06-28 DIAGNOSIS — Z23 Encounter for immunization: Secondary | ICD-10-CM

## 2019-06-28 NOTE — Progress Notes (Signed)
   Covid-19 Vaccination Clinic  Name:  Scott Joyce    MRN: [id number] DOB: 01-27-1981  06/28/2019  [name] was observed post Covid-19 immunization for 15 minutes without incident. He was provided with Vaccine Information Sheet and instruction to access the V-Safe system.   [name] was instructed to call 911 with any severe reactions post vaccine: Marland Kitchen Difficulty breathing  . Swelling of face and throat  . A fast heartbeat  . A bad rash all over body  . Dizziness and weakness   Immunizations Administered    Name Date Dose VIS Date Route   Pfizer COVID-19 Vaccine 06/28/2019  3:18 PM 0.3 mL 05/03/2018 Intramuscular   Manufacturer: [employer], [employer]   Lot: [id number]   NDC: [address]-3

## 2019-07-06 ENCOUNTER — Other Ambulatory Visit: Payer: Self-pay | Admitting: Internal Medicine

## 2019-07-06 NOTE — Telephone Encounter (Signed)
Last filled 05/18/2019.... please advise 

## 2019-07-06 NOTE — Telephone Encounter (Signed)
How is his anxiety. Would he be open to discussing SSRi and less Xanax use?

## 2019-07-10 NOTE — Telephone Encounter (Signed)
Last read by Jeannie Fend at 3:13 PM on 07/07/2019

## 2020-01-05 ENCOUNTER — Encounter: Payer: Self-pay | Admitting: Internal Medicine

## 2020-01-05 ENCOUNTER — Ambulatory Visit: Payer: PRIVATE HEALTH INSURANCE | Admitting: Internal Medicine

## 2020-01-05 ENCOUNTER — Other Ambulatory Visit: Payer: Self-pay

## 2020-01-05 VITALS — BP 130/90 | HR 105 | Temp 99.0°F | Ht 72.5 in | Wt 211.0 lb

## 2020-01-05 DIAGNOSIS — Z0000 Encounter for general adult medical examination without abnormal findings: Secondary | ICD-10-CM | POA: Diagnosis not present

## 2020-01-05 DIAGNOSIS — R1033 Periumbilical pain: Secondary | ICD-10-CM | POA: Diagnosis not present

## 2020-01-05 DIAGNOSIS — E785 Hyperlipidemia, unspecified: Secondary | ICD-10-CM | POA: Insufficient documentation

## 2020-01-05 DIAGNOSIS — Z23 Encounter for immunization: Secondary | ICD-10-CM

## 2020-01-05 DIAGNOSIS — E7800 Pure hypercholesterolemia, unspecified: Secondary | ICD-10-CM | POA: Diagnosis not present

## 2020-01-05 DIAGNOSIS — F419 Anxiety disorder, unspecified: Secondary | ICD-10-CM

## 2020-01-05 MED ORDER — ESCITALOPRAM OXALATE 5 MG PO TABS: 5.0000 mg | ORAL_TABLET | Freq: Every day | ORAL | 2 refills | Status: DC

## 2020-01-05 MED ORDER — ALPRAZOLAM 0.25 MG PO TABS: 0.2500 mg | ORAL_TABLET | Freq: Every day | ORAL | 0 refills | Status: DC | PRN

## 2020-01-05 NOTE — Addendum Note (Signed)
Addended by: Alvina Chou on: 01/05/2020 03:02 PM   Modules accepted: Orders

## 2020-01-05 NOTE — Assessment & Plan Note (Signed)
CMET and lipid profile today Encouraged him to consume a low fat diet 

## 2020-01-05 NOTE — Patient Instructions (Signed)

## 2020-01-05 NOTE — Progress Notes (Signed)
Subjective:    Patient ID: Scott Joyce, male    DOB: 07-18-1980, 39 y.o.   MRN: [id number]  HPI  Pt presents to the clinic today for his annual exam.   Anxiety: Underwent cardiac workup this year- negative. He reports persistent symptoms of intermittent palpitations, shortness of breath, lightheadedness. Notices this more when standing in line in stores waiting to be checked out, or in the car driving alone. He is not currently seeing a therapist. He takes Xanax sparingly. He denies depression, SI/HI.  HLD: His last LDL was 140, 12/2018. He is not taking Atorvastatin as prescribed. He tries to consume a low fat diet.  Flu: 11/2018 Tetanus: 04/2016 Covid: [employer] Vision Screening: annually Dentist: annually  Diet: He does eat meat. He consumes more veggies than fruits. He does eat some fried foods. He drinks mostly Dr. Reino Kent, water, Gatorade. Exercise: None   Review of Systems  Past Medical History:  Diagnosis Date  . Esophageal stricture   . GERD (gastroesophageal reflux disease)   . History of chicken pox     Current Outpatient Medications  Medication Sig Dispense Refill  . ALPRAZolam (XANAX) 0.25 MG tablet Take 1 tablet (0.25 mg total) by mouth daily as needed for anxiety. 10 tablet 0  . atorvastatin (LIPITOR) 10 MG tablet TAKE 1 TABLET (10 MG TOTAL) BY MOUTH DAILY. MUST SCHEDULE LAB APPOINTMENT 90 tablet 0  . Cyanocobalamin (B-12 PO) Take by mouth daily.    Marland Kitchen VITAMIN D PO Take by mouth daily.     No current facility-administered medications for this visit.    No Known Allergies  Family History  Problem Relation Age of Onset  . Breast cancer Paternal Grandmother     Social History   Socioeconomic History  . Marital status: Single    Spouse name: Not on file  . Number of children: Not on file  . Years of education: Not on file  . Highest education level: Not on file  Occupational History  . Not on file  Tobacco Use  . Smoking status: Never Smoker  .  Smokeless tobacco: Never Used  Vaping Use  . Vaping Use: Never used  Substance and Sexual Activity  . Alcohol use: Yes    Comment: occasional  . Drug use: No  . Sexual activity: Yes  Other Topics Concern  . Not on file  Social History Narrative  . Not on file   Social Determinants of Health   Financial Resource Strain:   . Difficulty of Paying Living Expenses: Not on file  Food Insecurity:   . Worried About Programme researcher, broadcasting/film/video in the Last Year: Not on file  . Ran Out of Food in the Last Year: Not on file  Transportation Needs:   . Lack of Transportation (Medical): Not on file  . Lack of Transportation (Non-Medical): Not on file  Physical Activity:   . Days of Exercise per Week: Not on file  . Minutes of Exercise per Session: Not on file  Stress:   . Feeling of Stress : Not on file  Social Connections:   . Frequency of Communication with Friends and Family: Not on file  . Frequency of Social Gatherings with Friends and Family: Not on file  . Attends Religious Services: Not on file  . Active Member of Clubs or Organizations: Not on file  . Attends Banker Meetings: Not on file  . Marital Status: Not on file  Intimate Partner Violence:   . Fear of  Current or Ex-Partner: Not on file  . Emotionally Abused: Not on file  . Physically Abused: Not on file  . Sexually Abused: Not on file     Constitutional: Denies fever, malaise, fatigue, headache or abrupt weight changes.  HEENT: Denies eye pain, eye redness, ear pain, ringing in the ears, wax buildup, runny nose, nasal congestion, bloody nose, or sore throat. Respiratory: Denies difficulty breathing, shortness of breath, cough or sputum production.   Cardiovascular: Denies chest pain, chest tightness, palpitations or swelling in the hands or feet.  Gastrointestinal: Pt reports umbilical pain. Denies abdominal pain, bloating, constipation, diarrhea or blood in the stool.  GU: Denies urgency, frequency, pain with  urination, burning sensation, blood in urine, odor or discharge. Musculoskeletal: Denies decrease in range of motion, difficulty with gait, muscle pain or joint pain and swelling.  Skin: Denies redness, rashes, lesions or ulcercations.  Neurological: Denies dizziness, difficulty with memory, difficulty with speech or problems with balance and coordination.  Psych: Pt has a history of anxiety. Denies depression, SI/HI.  No other specific complaints in a complete review of systems (except as listed in HPI above).     Objective:   Physical Exam  BP 130/90   Pulse (!) 105   Temp 99 F (37.2 C) (Temporal)   Ht 6' 0.5" (1.842 m)   Wt 211 lb (95.7 kg)   SpO2 98%   BMI 28.22 kg/m   Wt Readings from Last 3 Encounters:  02/20/19 201 lb 4 oz (91.3 kg)  02/06/19 200 lb (90.7 kg)  01/02/19 203 lb (92.1 kg)    General: Appears his stated age, well developed, well nourished in NAD. Skin: Warm, dry and intact. No rashes, lesions or ulcerations noted. HEENT: Head: normal shape and size; Eyes: sclera white, no icterus, conjunctiva pink, PERRLA and EOMs intact;  Neck:  Neck supple, trachea midline. No masses, lumps or thyromegaly present.  Cardiovascular: Normal rate and rhythm. S1,S2 noted.  No murmur, rubs or gallops noted. No JVD or BLE edema.  Pulmonary/Chest: Normal effort and positive vesicular breath sounds. No respiratory distress. No wheezes, rales or ronchi noted.  Abdomen: Soft and nontender. Normal bowel sounds. No distention or masses noted. Tenderness with palpation over and just below the umbilicus but no definite hernia noted. Liver, spleen and kidneys non palpable. Musculoskeletal: Strength 5/5 BUE/BLE. No signs of joint swelling. No difficulty with gait.  Neurological: Alert and oriented. Cranial nerves II-XII grossly intact. Coordination normal.  Psychiatric: Mood and affect normal. Behavior is normal. Judgment and thought content normal.     BMET    Component Value  Date/Time   NA 137 01/02/2019 1159   K 4.2 01/02/2019 1159   CL 101 01/02/2019 1159   CO2 29 01/02/2019 1159   GLUCOSE 94 01/02/2019 1159   BUN 11 01/02/2019 1159   CREATININE 0.90 01/02/2019 1159   CALCIUM 9.2 01/02/2019 1159    Lipid Panel     Component Value Date/Time   CHOL 235 (H) 01/02/2019 1159   TRIG 218.0 (H) 01/02/2019 1159   HDL 50.70 01/02/2019 1159   CHOLHDL 5 01/02/2019 1159   VLDL 43.6 (H) 01/02/2019 1159    CBC    Component Value Date/Time   WBC 6.0 01/02/2019 1159   RBC 5.49 01/02/2019 1159   HGB 15.9 01/02/2019 1159   HGB 16.0 08/09/2011 0935   HCT 47.3 01/02/2019 1159   HCT 48.1 08/09/2011 0935   PLT 217.0 01/02/2019 1159   PLT 169 08/09/2011 0935  MCV 86.3 01/02/2019 1159   MCV 87 08/09/2011 0935   MCH 29.0 08/09/2011 0935   MCHC 33.6 01/02/2019 1159   RDW 13.1 01/02/2019 1159   RDW 13.0 08/09/2011 0935   LYMPHSABS 0.6 (L) 08/09/2011 0935   MONOABS 0.7 08/09/2011 0935   EOSABS 0.0 08/09/2011 0935   BASOSABS 0.0 08/09/2011 0935    Hgb A1C No results found for: HGBA1C         Assessment & Plan:   Preventative Health Maintenance:  Flu shot today Tetanus UTD Covid vaccine UTD Encouraged him to consume a balanced diet and exercise regimen Advised her to see an eye doctor and dentist annually Will check CBC, CMET, Lipid and A1C today  Umbilical Pain:  Will monitor for now If persists or worsens, he will let me know  RTC in 1 year, sooner if needed Nicki Reaper, NP This visit occurred during the SARS-CoV-2 public health emergency.  Safety protocols were in place, including screening questions prior to the visit, additional usage of staff PPE, and extensive cleaning of exam room while observing appropriate contact time as indicated for disinfecting solutions.

## 2020-01-05 NOTE — Assessment & Plan Note (Signed)
Will trial Escitalopram 5 mg daily Discussed side effects, risks of SI He declines seeing a therapist at this time Xanax refilled today

## 2020-01-06 LAB — COMPREHENSIVE METABOLIC PANEL
AG Ratio: 1.6 (calc) (ref 1.0–2.5)
ALT: 21 U/L (ref 9–46)
AST: 17 U/L (ref 10–40)
Albumin: 4.2 g/dL (ref 3.6–5.1)
Alkaline phosphatase (APISO): 67 U/L (ref 36–130)
BUN: 13 mg/dL (ref 7–25)
CO2: 25 mmol/L (ref 20–32)
Calcium: 8.9 mg/dL (ref 8.6–10.3)
Chloride: 103 mmol/L (ref 98–110)
Creat: 1.12 mg/dL (ref 0.60–1.35)
Globulin: 2.7 g/dL (calc) (ref 1.9–3.7)
Glucose, Bld: 89 mg/dL (ref 65–99)
Potassium: 3.9 mmol/L (ref 3.5–5.3)
Sodium: 138 mmol/L (ref 135–146)
Total Bilirubin: 0.5 mg/dL (ref 0.2–1.2)
Total Protein: 6.9 g/dL (ref 6.1–8.1)

## 2020-01-06 LAB — LIPID PANEL
Cholesterol: 207 mg/dL — ABNORMAL HIGH (ref <200)
HDL: 49 mg/dL (ref >=40)
LDL Cholesterol (Calc): 110 mg/dL (calc) — ABNORMAL HIGH
Non-HDL Cholesterol (Calc): 158 mg/dL (calc) — ABNORMAL HIGH (ref <130)
Total CHOL/HDL Ratio: 4.2 (calc) (ref <5.0)
Triglycerides: 361 mg/dL — ABNORMAL HIGH (ref <150)

## 2020-01-06 LAB — CBC
HCT: 46 % (ref 38.5–50.0)
Hemoglobin: 15.8 g/dL (ref 13.2–17.1)
MCH: 28.9 pg (ref 27.0–33.0)
MCHC: 34.3 g/dL (ref 32.0–36.0)
MCV: 84.1 fL (ref 80.0–100.0)
MPV: 11.4 fL (ref 7.5–12.5)
Platelets: 251 10*3/uL (ref 140–400)
RBC: 5.47 10*6/uL (ref 4.20–5.80)
RDW: 12.8 % (ref 11.0–15.0)
WBC: 10.1 10*3/uL (ref 3.8–10.8)

## 2020-01-06 LAB — HEMOGLOBIN A1C
Hgb A1c MFr Bld: 5.6 % of total Hgb (ref <5.7)
Mean Plasma Glucose: 114 (calc)
eAG (mmol/L): 6.3 (calc)

## 2020-01-27 ENCOUNTER — Other Ambulatory Visit: Payer: Self-pay | Admitting: Internal Medicine

## 2020-01-27 DIAGNOSIS — F419 Anxiety disorder, unspecified: Secondary | ICD-10-CM

## 2020-06-27 ENCOUNTER — Encounter: Payer: Self-pay | Admitting: Internal Medicine

## 2020-06-27 DIAGNOSIS — F419 Anxiety disorder, unspecified: Secondary | ICD-10-CM

## 2020-07-22 MED ORDER — ESCITALOPRAM OXALATE 5 MG PO TABS: ORAL_TABLET | ORAL | 1 refills | Status: DC

## 2020-07-22 NOTE — Telephone Encounter (Signed)
Please review.  KP

## 2021-01-06 ENCOUNTER — Other Ambulatory Visit: Payer: Self-pay

## 2021-01-06 ENCOUNTER — Encounter: Payer: PRIVATE HEALTH INSURANCE | Admitting: Internal Medicine

## 2021-01-06 ENCOUNTER — Ambulatory Visit (INDEPENDENT_AMBULATORY_CARE_PROVIDER_SITE_OTHER): Payer: PRIVATE HEALTH INSURANCE | Admitting: Internal Medicine

## 2021-01-06 ENCOUNTER — Encounter: Payer: Self-pay | Admitting: Internal Medicine

## 2021-01-06 VITALS — BP 121/81 | HR 71 | Temp 97.7°F | Resp 17 | Ht 72.5 in | Wt 221.6 lb

## 2021-01-06 DIAGNOSIS — Z0001 Encounter for general adult medical examination with abnormal findings: Secondary | ICD-10-CM | POA: Diagnosis not present

## 2021-01-06 DIAGNOSIS — Z6829 Body mass index (BMI) 29.0-29.9, adult: Secondary | ICD-10-CM

## 2021-01-06 DIAGNOSIS — Z23 Encounter for immunization: Secondary | ICD-10-CM | POA: Diagnosis not present

## 2021-01-06 DIAGNOSIS — E663 Overweight: Secondary | ICD-10-CM

## 2021-01-06 DIAGNOSIS — F419 Anxiety disorder, unspecified: Secondary | ICD-10-CM

## 2021-01-06 DIAGNOSIS — Z1159 Encounter for screening for other viral diseases: Secondary | ICD-10-CM | POA: Diagnosis not present

## 2021-01-06 DIAGNOSIS — N529 Male erectile dysfunction, unspecified: Secondary | ICD-10-CM | POA: Diagnosis not present

## 2021-01-06 DIAGNOSIS — E781 Pure hyperglyceridemia: Secondary | ICD-10-CM | POA: Diagnosis not present

## 2021-01-06 DIAGNOSIS — E6609 Other obesity due to excess calories: Secondary | ICD-10-CM | POA: Insufficient documentation

## 2021-01-06 MED ORDER — ESCITALOPRAM OXALATE 5 MG PO TABS: ORAL_TABLET | ORAL | 3 refills | Status: DC

## 2021-01-06 NOTE — Progress Notes (Signed)
Subjective:    Patient ID: Scott Joyce, male    DOB: [birth year], 40 y.o.   MRN: [id number]  HPI  Pt presents to the clinic today for his annual exam. He is also due to follow up chronic conditions.  Anxiety: Intermittent. He takes Escitalopram. He is no longer taking Xanax as needed. He is not currently seeing a therapist. He denies depression, SI/HI.  HLD: His last LDL was 110, triglycerides 361, 12/2019. He is not taking Atorvastatin as prescribed. He does not consume a low fat diet.  ED: He reports his erection is not the same as it used to be. He is getting "blue man's chew" from an online MD/pharmacy. He reports this works well for him.  Flu: 12/2019 Tetanus: 04/2016 Covid: [employer] x 2 Vision Screening: annually Dentist: annually  Diet: He does eat meat. He consume more veggies than frutis. He does eat some fried foods. He drinks mostly water, soda, gatorade Exercise: None  Review of Systems     Past Medical History:  Diagnosis Date   Esophageal stricture    GERD (gastroesophageal reflux disease)    History of chicken pox     Current Outpatient Medications  Medication Sig Dispense Refill   ALPRAZolam (XANAX) 0.25 MG tablet Take 1 tablet (0.25 mg total) by mouth daily as needed for anxiety. 10 tablet 0   ASHWAGANDHA PO Take by mouth.     atorvastatin (LIPITOR) 10 MG tablet TAKE 1 TABLET (10 MG TOTAL) BY MOUTH DAILY. MUST SCHEDULE LAB APPOINTMENT (Patient not taking: Reported on 01/05/2020) 90 tablet 0   escitalopram (LEXAPRO) 5 MG tablet TAKE 1 TABLET BY MOUTH EVERYDAY AT BEDTIME 90 tablet 1   Ginseng 100 MG CAPS Take by mouth.     No current facility-administered medications for this visit.    No Known Allergies  Family History  Problem Relation Age of Onset   Breast cancer Paternal Grandmother     Social History   Socioeconomic History   Marital status: Single    Spouse name: Not on file   Number of children: Not on file   Years of education: Not on  file   Highest education level: Not on file  Occupational History   Not on file  Tobacco Use   Smoking status: Never   Smokeless tobacco: Never  Vaping Use   Vaping Use: Never used  Substance and Sexual Activity   Alcohol use: Yes    Comment: occasional   Drug use: No   Sexual activity: Yes  Other Topics Concern   Not on file  Social History Narrative   Not on file   Social Determinants of Health   Financial Resource Strain: Not on file  Food Insecurity: Not on file  Transportation Needs: Not on file  Physical Activity: Not on file  Stress: Not on file  Social Connections: Not on file  Intimate Partner Violence: Not on file     Constitutional: Denies fever, malaise, fatigue, headache or abrupt weight changes.  HEENT: Denies eye pain, eye redness, ear pain, ringing in the ears, wax buildup, runny nose, nasal congestion, bloody nose, or sore throat. Respiratory: Denies difficulty breathing, shortness of breath, cough or sputum production.   Cardiovascular: Denies chest pain, chest tightness, palpitations or swelling in the hands or feet.  Gastrointestinal: Denies abdominal pain, bloating, constipation, diarrhea or blood in the stool.  GU: Pt reports erectile dysfunction. Denies urgency, frequency, pain with urination, burning sensation, blood in urine, odor or discharge. Musculoskeletal: Denies  decrease in range of motion, difficulty with gait, muscle pain or joint pain and swelling.  Skin: Denies redness, rashes, lesions or ulcercations.  Neurological: Denies dizziness, difficulty with memory, difficulty with speech or problems with balance and coordination.  Psych: Pt reports intermittent anxiety. Denies depression, SI/HI.  No other specific complaints in a complete review of systems (except as listed in HPI above).  Objective:   Physical Exam BP 121/81 (BP Location: Right Arm, Patient Position: Sitting, Cuff Size: Large)   Pulse 71   Temp 97.7 F (36.5 C) (Temporal)    Resp 17   Ht 6' 0.5" (1.842 m)   Wt 221 lb 9.6 oz (100.5 kg)   SpO2 99%   BMI 29.64 kg/m   Wt Readings from Last 3 Encounters:  01/05/20 211 lb (95.7 kg)  02/20/19 201 lb 4 oz (91.3 kg)  02/06/19 200 lb (90.7 kg)    General: Appears their stated age, overweight, in NAD. Skin: Warm, dry and intact. Marland Kitchen HEENT: Head: normal shape and size; Eyes: EOMs intact;  Neck:  Neck supple, trachea midline. No masses, lumps or thyromegaly present.  Cardiovascular: Normal rate and rhythm. S1,S2 noted.  No murmur, rubs or gallops noted. No JVD or BLE edema. Pulmonary/Chest: Normal effort and positive vesicular breath sounds. No respiratory distress. No wheezes, rales or ronchi noted.  Abdomen: Soft and nontender. Normal bowel sounds. Umbilical hernia noted. Liver, spleen and kidneys non palpable. Musculoskeletal: Strength 5/5 BUE/BLE. No difficulty with gait.  Neurological: Alert and oriented. Cranial nerves II-XII grossly intact. Coordination normal.  Psychiatric: Mood and affect normal. Behavior is normal. Judgment and thought content normal.    BMET    Component Value Date/Time   NA 138 01/05/2020 1502   K 3.9 01/05/2020 1502   CL 103 01/05/2020 1502   CO2 25 01/05/2020 1502   GLUCOSE 89 01/05/2020 1502   BUN 13 01/05/2020 1502   CREATININE 1.12 01/05/2020 1502   CALCIUM 8.9 01/05/2020 1502    Lipid Panel     Component Value Date/Time   CHOL 207 (H) 01/05/2020 1502   TRIG 361 (H) 01/05/2020 1502   HDL 49 01/05/2020 1502   CHOLHDL 4.2 01/05/2020 1502   VLDL 43.6 (H) 01/02/2019 1159   LDLCALC 110 (H) 01/05/2020 1502    CBC    Component Value Date/Time   WBC 10.1 01/05/2020 1502   RBC 5.47 01/05/2020 1502   HGB 15.8 01/05/2020 1502   HGB 16.0 08/09/2011 0935   HCT 46.0 01/05/2020 1502   HCT 48.1 08/09/2011 0935   PLT 251 01/05/2020 1502   PLT 169 08/09/2011 0935   MCV 84.1 01/05/2020 1502   MCV 87 08/09/2011 0935   MCH 28.9 01/05/2020 1502   MCHC 34.3 01/05/2020 1502    RDW 12.8 01/05/2020 1502   RDW 13.0 08/09/2011 0935   LYMPHSABS 0.6 (L) 08/09/2011 0935   MONOABS 0.7 08/09/2011 0935   EOSABS 0.0 08/09/2011 0935   BASOSABS 0.0 08/09/2011 0935    Hgb A1C Lab Results  Component Value Date   HGBA1C 5.6 01/05/2020          Assessment & Plan:   Preventative Health Maintenance:  Flu shot today Tetanus UTD Encouraged him to get his covid booster Encouraged him to consume a balanced diet and exercise regimen Advised him to see an eye doctor and dentist annually Will check CBC, CMET, Lipid, A1C and Hep C today  RTC in 1 year, sooner if needed Nicki Reaper, NP This visit occurred  during the SARS-CoV-2 public health emergency.  Safety protocols were in place, including screening questions prior to the visit, additional usage of staff PPE, and extensive cleaning of exam room while observing appropriate contact time as indicated for disinfecting solutions.

## 2021-01-06 NOTE — Assessment & Plan Note (Signed)
Being treated by online MD/pharmacy

## 2021-01-06 NOTE — Assessment & Plan Note (Signed)
CMET and lipid profile today Encouraged him to consume a low fat diet 

## 2021-01-06 NOTE — Assessment & Plan Note (Signed)
Stable on his current dose of Escitalopram, refilled today Support offered

## 2021-01-06 NOTE — Assessment & Plan Note (Signed)
Encouraged diet and exercise for weight loss ?

## 2021-01-07 LAB — COMPLETE METABOLIC PANEL WITH GFR
AG Ratio: 1.7 (calc) (ref 1.0–2.5)
ALT: 32 U/L (ref 9–46)
AST: 23 U/L (ref 10–40)
Albumin: 4.5 g/dL (ref 3.6–5.1)
Alkaline phosphatase (APISO): 69 U/L (ref 36–130)
BUN: 10 mg/dL (ref 7–25)
CO2: 25 mmol/L (ref 20–32)
Calcium: 9.4 mg/dL (ref 8.6–10.3)
Chloride: 102 mmol/L (ref 98–110)
Creat: 0.96 mg/dL (ref 0.60–1.29)
Globulin: 2.7 g/dL (calc) (ref 1.9–3.7)
Glucose, Bld: 92 mg/dL (ref 65–139)
Potassium: 4 mmol/L (ref 3.5–5.3)
Sodium: 137 mmol/L (ref 135–146)
Total Bilirubin: 0.6 mg/dL (ref 0.2–1.2)
Total Protein: 7.2 g/dL (ref 6.1–8.1)
eGFR: 101 mL/min/{1.73_m2} (ref >=60)

## 2021-01-07 LAB — CBC
HCT: 47.2 % (ref 38.5–50.0)
Hemoglobin: 16.1 g/dL (ref 13.2–17.1)
MCH: 29.5 pg (ref 27.0–33.0)
MCHC: 34.1 g/dL (ref 32.0–36.0)
MCV: 86.6 fL (ref 80.0–100.0)
MPV: 11.5 fL (ref 7.5–12.5)
Platelets: 233 10*3/uL (ref 140–400)
RBC: 5.45 10*6/uL (ref 4.20–5.80)
RDW: 12.9 % (ref 11.0–15.0)
WBC: 6.1 10*3/uL (ref 3.8–10.8)

## 2021-01-07 LAB — LIPID PANEL
Cholesterol: 248 mg/dL — ABNORMAL HIGH (ref <200)
HDL: 48 mg/dL (ref >=40)
Non-HDL Cholesterol (Calc): 200 mg/dL (calc) — ABNORMAL HIGH (ref <130)
Total CHOL/HDL Ratio: 5.2 (calc) — ABNORMAL HIGH (ref <5.0)
Triglycerides: 404 mg/dL — ABNORMAL HIGH (ref <150)

## 2021-01-07 LAB — HEMOGLOBIN A1C
Hgb A1c MFr Bld: 5.5 % of total Hgb (ref <5.7)
Mean Plasma Glucose: 111 mg/dL
eAG (mmol/L): 6.2 mmol/L

## 2021-01-07 LAB — HEPATITIS C ANTIBODY
Hepatitis C Ab: NONREACTIVE
SIGNAL TO CUT-OFF: 0.07 (ref <1.00)

## 2021-12-29 ENCOUNTER — Other Ambulatory Visit: Payer: Self-pay | Admitting: Internal Medicine

## 2021-12-29 DIAGNOSIS — F419 Anxiety disorder, unspecified: Secondary | ICD-10-CM

## 2022-03-26 ENCOUNTER — Other Ambulatory Visit: Payer: Self-pay | Admitting: Internal Medicine

## 2022-03-26 DIAGNOSIS — F419 Anxiety disorder, unspecified: Secondary | ICD-10-CM

## 2022-03-26 NOTE — Telephone Encounter (Signed)
Requested medication (s) are due for refill today: Due 04/01/22  Requested medication (s) are on the active medication list: yes    Last refill: 12/30/21  #90 0 refills  Future visit scheduled no  Notes to clinic: Attempted to reach pt to secure appt, left VM to call back.  Requested Prescriptions  Pending Prescriptions Disp Refills   escitalopram (LEXAPRO) 5 MG tablet [Pharmacy Med Name: ESCITALOPRAM 5 MG TABLET] 90 tablet 0    Sig: TAKE 1 TABLET BY MOUTH EVERYDAY AT BEDTIME     Psychiatry:  Antidepressants - SSRI Failed - 03/26/2022  1:29 AM      Failed - Valid encounter within last 6 months    Recent Outpatient Visits           1 year ago Encounter for general adult medical examination with abnormal findings   [facility], Coralie Keens, NP

## 2022-04-21 ENCOUNTER — Other Ambulatory Visit: Payer: Self-pay | Admitting: Internal Medicine

## 2022-04-21 ENCOUNTER — Encounter: Payer: Self-pay | Admitting: Internal Medicine

## 2022-04-21 DIAGNOSIS — F419 Anxiety disorder, unspecified: Secondary | ICD-10-CM

## 2022-05-29 ENCOUNTER — Ambulatory Visit (INDEPENDENT_AMBULATORY_CARE_PROVIDER_SITE_OTHER): Payer: BC Managed Care – PPO | Admitting: Internal Medicine

## 2022-05-29 ENCOUNTER — Encounter: Payer: Self-pay | Admitting: Internal Medicine

## 2022-05-29 VITALS — BP 120/76 | HR 78 | Temp 97.1°F | Ht 72.0 in | Wt 223.0 lb

## 2022-05-29 DIAGNOSIS — E781 Pure hyperglyceridemia: Secondary | ICD-10-CM | POA: Diagnosis not present

## 2022-05-29 DIAGNOSIS — E66811 Obesity, class 1: Secondary | ICD-10-CM

## 2022-05-29 DIAGNOSIS — R7309 Other abnormal glucose: Secondary | ICD-10-CM

## 2022-05-29 DIAGNOSIS — N529 Male erectile dysfunction, unspecified: Secondary | ICD-10-CM

## 2022-05-29 DIAGNOSIS — Z6830 Body mass index (BMI) 30.0-30.9, adult: Secondary | ICD-10-CM

## 2022-05-29 DIAGNOSIS — F419 Anxiety disorder, unspecified: Secondary | ICD-10-CM | POA: Diagnosis not present

## 2022-05-29 DIAGNOSIS — E6609 Other obesity due to excess calories: Secondary | ICD-10-CM

## 2022-05-29 DIAGNOSIS — R739 Hyperglycemia, unspecified: Secondary | ICD-10-CM

## 2022-05-29 DIAGNOSIS — Z0001 Encounter for general adult medical examination with abnormal findings: Secondary | ICD-10-CM | POA: Diagnosis not present

## 2022-05-29 MED ORDER — ESCITALOPRAM OXALATE 5 MG PO TABS: ORAL_TABLET | ORAL | 3 refills | Status: DC

## 2022-05-29 NOTE — Patient Instructions (Signed)
Health Maintenance, Male Adopting a healthy lifestyle and getting preventive care are important in promoting health and wellness. Ask your health care provider about: The right schedule for you to have regular tests and exams. Things you can do on your own to prevent diseases and keep yourself healthy. What should I know about diet, weight, and exercise? Eat a healthy diet  Eat a diet that includes plenty of vegetables, fruits, low-fat dairy products, and lean protein. Do not eat a lot of foods that are high in solid fats, added sugars, or sodium. Maintain a healthy weight Body mass index (BMI) is a measurement that can be used to identify possible weight problems. It estimates body fat based on height and weight. Your health care provider can help determine your BMI and help you achieve or maintain a healthy weight. Get regular exercise Get regular exercise. This is one of the most important things you can do for your health. Most adults should: Exercise for at least 150 minutes each week. The exercise should increase your heart rate and make you sweat (moderate-intensity exercise). Do strengthening exercises at least twice a week. This is in addition to the moderate-intensity exercise. Spend less time sitting. Even light physical activity can be beneficial. Watch cholesterol and blood lipids Have your blood tested for lipids and cholesterol at 44 years of age, then have this test every 5 years. You may need to have your cholesterol levels checked more often if: Your lipid or cholesterol levels are high. You are older than 44 years of age. You are at high risk for heart disease. What should I know about cancer screening? Many types of cancers can be detected early and may often be prevented. Depending on your health history and family history, you may need to have cancer screening at various ages. This may include screening for: Colorectal cancer. Prostate cancer. Skin cancer. Lung  cancer. What should I know about heart disease, diabetes, and high blood pressure? Blood pressure and heart disease High blood pressure causes heart disease and increases the risk of stroke. This is more likely to develop in people who have high blood pressure readings or are overweight. Talk with your health care provider about your target blood pressure readings. Have your blood pressure checked: Every 3-5 years if you are 18-39 years of age. Every year if you are 40 years old or older. If you are between the ages of 65 and 75 and are a current or former smoker, ask your health care provider if you should have a one-time screening for abdominal aortic aneurysm (AAA). Diabetes Have regular diabetes screenings. This checks your fasting blood sugar level. Have the screening done: Once every three years after age 45 if you are at a normal weight and have a low risk for diabetes. More often and at a younger age if you are overweight or have a high risk for diabetes. What should I know about preventing infection? Hepatitis B If you have a higher risk for hepatitis B, you should be screened for this virus. Talk with your health care provider to find out if you are at risk for hepatitis B infection. Hepatitis C Blood testing is recommended for: Everyone born from 1945 through 1965. Anyone with known risk factors for hepatitis C. Sexually transmitted infections (STIs) You should be screened each year for STIs, including gonorrhea and chlamydia, if: You are sexually active and are younger than 44 years of age. You are older than 44 years of age and your   health care provider tells you that you are at risk for this type of infection. Your sexual activity has changed since you were last screened, and you are at increased risk for chlamydia or gonorrhea. Ask your health care provider if you are at risk. Ask your health care provider about whether you are at high risk for HIV. Your health care provider  may recommend a prescription medicine to help prevent HIV infection. If you choose to take medicine to prevent HIV, you should first get tested for HIV. You should then be tested every 3 months for as long as you are taking the medicine. Follow these instructions at home: Alcohol use Do not drink alcohol if your health care provider tells you not to drink. If you drink alcohol: Limit how much you have to 0-2 drinks a day. Know how much alcohol is in your drink. In the [place] one drink equals one 12 oz bottle of beer (355 mL), one 5 oz glass of wine (148 mL), or one 1 oz glass of hard liquor (44 mL). Lifestyle Do not use any products that contain nicotine or tobacco. These products include cigarettes, chewing tobacco, and vaping devices, such as e-cigarettes. If you need help quitting, ask your health care provider. Do not use street drugs. Do not share needles. Ask your health care provider for help if you need support or information about quitting drugs. General instructions Schedule regular health, dental, and eye exams. Stay current with your vaccines. Tell your health care provider if: You often feel depressed. You have ever been abused or do not feel safe at home. Summary Adopting a healthy lifestyle and getting preventive care are important in promoting health and wellness. Follow your health care provider's instructions about healthy diet, exercising, and getting tested or screened for diseases. Follow your health care provider's instructions on monitoring your cholesterol and blood pressure. This information is not intended to replace advice given to you by your health care provider. Make sure you discuss any questions you have with your health care provider. Document Revised: 07/15/2020 Document Reviewed: 07/15/2020 Elsevier Patient Education  2023 [employer]  

## 2022-05-29 NOTE — Progress Notes (Signed)
Subjective:    Patient ID: Scott Joyce, male    DOB: 16-Jan-1981, 42 y.o.   MRN: [id number]  HPI  Patient presents to clinic today for his annual exam.  Flu: 12/2020 Tetanus: 04/2016 COVID: Pfizer x 2 Vision screening: annually Dentist: biannually  Diet: He does eat meat. He consumes fruits and veggies. He does eat some fried foods. He drinks mostly water, energy drinks Exercise: 2-3 gym, mainly lifting  Review of Systems     Past Medical History:  Diagnosis Date   Esophageal stricture    GERD (gastroesophageal reflux disease)    History of chicken pox     Current Outpatient Medications  Medication Sig Dispense Refill   ALPRAZolam (XANAX) 0.25 MG tablet Take 1 tablet (0.25 mg total) by mouth daily as needed for anxiety. (Patient not taking: Reported on 01/06/2021) 10 tablet 0   escitalopram (LEXAPRO) 5 MG tablet TAKE 1 TABLET BY MOUTH EVERYDAY AT BEDTIME 90 tablet 0   No current facility-administered medications for this visit.    No Known Allergies  Family History  Problem Relation Age of Onset   Breast cancer Paternal Grandmother     Social History   Socioeconomic History   Marital status: Single    Spouse name: Not on file   Number of children: Not on file   Years of education: Not on file   Highest education level: Not on file  Occupational History   Not on file  Tobacco Use   Smoking status: Never   Smokeless tobacco: Never  Vaping Use   Vaping Use: Never used  Substance and Sexual Activity   Alcohol use: Yes    Comment: occasional   Drug use: No   Sexual activity: Yes  Other Topics Concern   Not on file  Social History Narrative   Not on file   Social Determinants of Health   Financial Resource Strain: Not on file  Food Insecurity: Not on file  Transportation Needs: Not on file  Physical Activity: Not on file  Stress: Not on file  Social Connections: Not on file  Intimate Partner Violence: Not on file     Constitutional: Denies  fever, malaise, fatigue, headache or abrupt weight changes.  HEENT: Denies eye pain, eye redness, ear pain, ringing in the ears, wax buildup, runny nose, nasal congestion, bloody nose, or sore throat. Respiratory: Denies difficulty breathing, shortness of breath, cough or sputum production.   Cardiovascular: Denies chest pain, chest tightness, palpitations or swelling in the hands or feet.  Gastrointestinal: Denies abdominal pain, bloating, constipation, diarrhea or blood in the stool.  GU: Patient reports erectile dysfunction.  Denies urgency, frequency, pain with urination, burning sensation, blood in urine, odor or discharge. Musculoskeletal: Denies decrease in range of motion, difficulty with gait, muscle pain or joint pain and swelling.  Skin: Denies redness, rashes, lesions or ulcercations.  Neurological: Denies dizziness, difficulty with memory, difficulty with speech or problems with balance and coordination.  Psych: Patient has a history of anxiety.  Denies depression, SI/HI.  No other specific complaints in a complete review of systems (except as listed in HPI above).  Objective:   Physical Exam   BP 120/76 (BP Location: Left Arm, Patient Position: Sitting, Cuff Size: Normal)   Pulse 78   Temp (!) 97.1 F (36.2 C) (Temporal)   Ht 6' (1.829 m)   Wt 223 lb (101.2 kg)   SpO2 99%   BMI 30.24 kg/m   Wt Readings from Last 3 Encounters:  01/06/21 221 lb 9.6 oz (100.5 kg)  01/05/20 211 lb (95.7 kg)  02/20/19 201 lb 4 oz (91.3 kg)    General: Appears his stated age, obese, in NAD. Skin: Warm, dry and intact. No rashes noted. HEENT: Head: normal shape and size; Eyes: sclera white, no icterus, conjunctiva pink, PERRLA and EOMs intact;  Neck:  Neck supple, trachea midline. No masses, lumps or thyromegaly present.  Cardiovascular: Normal rate and rhythm. S1,S2 noted.  No murmur, rubs or gallops noted. No JVD or BLE edema.  Pulmonary/Chest: Normal effort and positive vesicular  breath sounds. No respiratory distress. No wheezes, rales or ronchi noted.  Abdomen: Soft and nontender. Normal bowel sounds.  Musculoskeletal: Strength 5/5 BUE/BLE. No difficulty with gait.  Neurological: Alert and oriented. Cranial nerves II-XII grossly intact. Coordination normal.  Psychiatric: Mood and affect normal. Behavior is normal. Judgment and thought content normal.    BMET    Component Value Date/Time   NA 137 01/06/2021 1502   K 4.0 01/06/2021 1502   CL 102 01/06/2021 1502   CO2 25 01/06/2021 1502   GLUCOSE 92 01/06/2021 1502   BUN 10 01/06/2021 1502   CREATININE 0.96 01/06/2021 1502   CALCIUM 9.4 01/06/2021 1502    Lipid Panel     Component Value Date/Time   CHOL 248 (H) 01/06/2021 1502   TRIG 404 (H) 01/06/2021 1502   HDL 48 01/06/2021 1502   CHOLHDL 5.2 (H) 01/06/2021 1502   VLDL 43.6 (H) 01/02/2019 1159   [place]  01/06/2021 1502     Comment:     . LDL cholesterol not calculated. Triglyceride levels greater than 400 mg/dL invalidate calculated LDL results. . Reference range: <100 . Desirable range <100 mg/dL for primary prevention;   <70 mg/dL for patients with CHD or diabetic patients  with > or = 2 CHD risk factors. Marland Kitchen LDL-C is now calculated using the Martin-Hopkins  calculation, which is a validated novel method providing  better accuracy than the Friedewald equation in the  estimation of LDL-C.  Cresenciano Genre et al. Annamaria Helling. [id number]): 2061-2068  (http://education.[facility].com/faq/FAQ164)     CBC    Component Value Date/Time   WBC 6.1 01/06/2021 1502   RBC 5.45 01/06/2021 1502   HGB 16.1 01/06/2021 1502   HGB 16.0 08/09/2011 0935   HCT 47.2 01/06/2021 1502   HCT 48.1 08/09/2011 0935   PLT 233 01/06/2021 1502   PLT 169 08/09/2011 0935   MCV 86.6 01/06/2021 1502   MCV 87 08/09/2011 0935   MCH 29.5 01/06/2021 1502   MCHC 34.1 01/06/2021 1502   RDW 12.9 01/06/2021 1502   RDW 13.0 08/09/2011 0935   LYMPHSABS 0.6 (L) 08/09/2011 0935    MONOABS 0.7 08/09/2011 0935   EOSABS 0.0 08/09/2011 0935   BASOSABS 0.0 08/09/2011 0935    Hgb A1C Lab Results  Component Value Date   HGBA1C 5.5 01/06/2021          Assessment & Plan:   Preventative Health Maintenance:  Encouraged him to get a flu shot in the fall Tetanus UTD Encouraged him to get his COVID booster Encouraged him to consume a balanced diet and exercise regimen Advised him to see an eye doctor and dentist annually We will check CBC, c-Met, lipid, A1c today  RTC in 6 months, follow-up chronic conditions Webb Silversmith, NP

## 2022-05-29 NOTE — Assessment & Plan Note (Signed)
Will check testosterone 

## 2022-05-29 NOTE — Assessment & Plan Note (Signed)
Encourage diet and exercise for weight loss 

## 2022-06-02 ENCOUNTER — Other Ambulatory Visit: Payer: PRIVATE HEALTH INSURANCE

## 2022-06-02 DIAGNOSIS — R7309 Other abnormal glucose: Secondary | ICD-10-CM | POA: Diagnosis not present

## 2022-06-02 DIAGNOSIS — Z0001 Encounter for general adult medical examination with abnormal findings: Secondary | ICD-10-CM

## 2022-06-02 DIAGNOSIS — N529 Male erectile dysfunction, unspecified: Secondary | ICD-10-CM | POA: Diagnosis not present

## 2022-06-02 DIAGNOSIS — E781 Pure hyperglyceridemia: Secondary | ICD-10-CM | POA: Diagnosis not present

## 2022-06-03 ENCOUNTER — Encounter: Payer: Self-pay | Admitting: Internal Medicine

## 2022-06-03 DIAGNOSIS — R7303 Prediabetes: Secondary | ICD-10-CM | POA: Insufficient documentation

## 2022-06-03 DIAGNOSIS — E782 Mixed hyperlipidemia: Secondary | ICD-10-CM

## 2022-06-03 DIAGNOSIS — R7989 Other specified abnormal findings of blood chemistry: Secondary | ICD-10-CM

## 2022-06-03 LAB — CBC
HCT: 45.5 % (ref 38.5–50.0)
Hemoglobin: 15 g/dL (ref 13.2–17.1)
MCH: 28.1 pg (ref 27.0–33.0)
MCHC: 33 g/dL (ref 32.0–36.0)
MCV: 85.4 fL (ref 80.0–100.0)
MPV: 11.3 fL (ref 7.5–12.5)
Platelets: 228 10*3/uL (ref 140–400)
RBC: 5.33 10*6/uL (ref 4.20–5.80)
RDW: 13.2 % (ref 11.0–15.0)
WBC: 5.8 10*3/uL (ref 3.8–10.8)

## 2022-06-03 LAB — COMPLETE METABOLIC PANEL WITH GFR
AG Ratio: 1.6 (calc) (ref 1.0–2.5)
ALT: 31 U/L (ref 9–46)
AST: 18 U/L (ref 10–40)
Albumin: 4.5 g/dL (ref 3.6–5.1)
Alkaline phosphatase (APISO): 60 U/L (ref 36–130)
BUN: 23 mg/dL (ref 7–25)
CO2: 26 mmol/L (ref 20–32)
Calcium: 9.5 mg/dL (ref 8.6–10.3)
Chloride: 100 mmol/L (ref 98–110)
Creat: 0.98 mg/dL (ref 0.60–1.29)
Globulin: 2.9 g/dL (calc) (ref 1.9–3.7)
Glucose, Bld: 92 mg/dL (ref 65–99)
Potassium: 4.2 mmol/L (ref 3.5–5.3)
Sodium: 137 mmol/L (ref 135–146)
Total Bilirubin: 0.6 mg/dL (ref 0.2–1.2)
Total Protein: 7.4 g/dL (ref 6.1–8.1)
eGFR: 98 mL/min/{1.73_m2} (ref >=60)

## 2022-06-03 LAB — LIPID PANEL
Cholesterol: 196 mg/dL (ref <200)
HDL: 56 mg/dL (ref >=40)
LDL Cholesterol (Calc): 113 mg/dL (calc) — ABNORMAL HIGH
Non-HDL Cholesterol (Calc): 140 mg/dL (calc) — ABNORMAL HIGH (ref <130)
Total CHOL/HDL Ratio: 3.5 (calc) (ref <5.0)
Triglycerides: 156 mg/dL — ABNORMAL HIGH (ref <150)

## 2022-06-03 LAB — HEMOGLOBIN A1C
Hgb A1c MFr Bld: 5.8 % of total Hgb — ABNORMAL HIGH (ref <5.7)
Mean Plasma Glucose: 120 mg/dL
eAG (mmol/L): 6.6 mmol/L

## 2022-06-03 LAB — TESTOSTERONE: Testosterone: 234 ng/dL — ABNORMAL LOW (ref 250–827)

## 2022-06-03 MED ORDER — ATORVASTATIN CALCIUM 10 MG PO TABS: 10.0000 mg | ORAL_TABLET | Freq: Every day | ORAL | 1 refills | Status: DC

## 2022-06-04 NOTE — Addendum Note (Signed)
Addended by: Jearld Fenton on: 06/04/2022 10:55 AM   Modules accepted: Orders

## 2022-06-18 ENCOUNTER — Ambulatory Visit (INDEPENDENT_AMBULATORY_CARE_PROVIDER_SITE_OTHER): Payer: BC Managed Care – PPO | Admitting: Urology

## 2022-06-18 ENCOUNTER — Encounter: Payer: Self-pay | Admitting: Urology

## 2022-06-18 VITALS — BP 138/86 | HR 79 | Ht 72.0 in | Wt 223.0 lb

## 2022-06-18 DIAGNOSIS — R7989 Other specified abnormal findings of blood chemistry: Secondary | ICD-10-CM | POA: Diagnosis not present

## 2022-06-18 DIAGNOSIS — E291 Testicular hypofunction: Secondary | ICD-10-CM

## 2022-06-18 NOTE — Progress Notes (Signed)
I, DeAsia L Maxie,acting as a scribe for Riki Altes, MD.,have documented all relevant documentation on the behalf of Riki Altes, MD,as directed by  Riki Altes, MD while in the presence of Riki Altes, MD.   Marcelle Overlie Plume,acting as a scribe for Riki Altes, MD.,have documented all relevant documentation on the behalf of Riki Altes, MD,as directed by  Riki Altes, MD while in the presence of Riki Altes, MD.  06/18/2022 11:11 AM   Jeannie Fend Feb 23, 1981 [id number]  Referring provider: Lorre Munroe, NP [address],  [place] [address]  Chief Complaint  Patient presents with   Establish Care   Hypogonadism    HPI: Scott Joyce is a 42 y.o. male who is referred for evaluation of hypogonadism.  Testosterone level on 06/02/2022 was 234 ng/dL   He requested a testosterone level be drawn at his last PCP follow up 5-6 year history of decreased libido and 1-2 year history of tiredness and fatigue Prior vasectomy No bothersome LUTS Denies dysuria, gross hematuria Denies flank, abdominal or pelvic pain   PMH: Past Medical History:  Diagnosis Date   Esophageal stricture    GERD (gastroesophageal reflux disease)    History of chicken pox     Surgical History: Past Surgical History:  Procedure Laterality Date   ESOPHAGEAL DILATION  2003   HERNIA REPAIR     VASECTOMY      Home Medications:  Allergies as of 06/18/2022   No Known Allergies      Medication List        Accurate as of June 18, 2022 11:11 AM. If you have any questions, ask your nurse or doctor.          atorvastatin 10 MG tablet Commonly known as: LIPITOR Take 1 tablet (10 mg total) by mouth daily.   escitalopram 5 MG tablet Commonly known as: LEXAPRO TAKE 1 TABLET BY MOUTH EVERYDAY AT BEDTIME        Family History: Family History  Problem Relation Age of Onset   Healthy Sister    Breast cancer Paternal Grandmother     Social History:  reports  that he has never smoked. He has never used smokeless tobacco. He reports current alcohol use. He reports that he does not use drugs.   Physical Exam: BP 138/86   Pulse 79   Ht 6' (1.829 m)   Wt 223 lb (101.2 kg)   BMI 30.24 kg/m   Constitutional:  Alert and oriented, No acute distress. HEENT: [place] AT Respiratory: Normal respiratory effort, no increased work of breathing. GU: Phallus is circumcised without lesions. Testes descended bilaterally without masses or tenderness. Estimated testes volume >20 cc bilaterally  Skin: No rashes, bruises or suspicious lesions. Psychiatric: Normal mood and affect.   Assessment & Plan:    1. Hypogonadism We discussed the diagnosis of testosterone is based on 2 abnormal total testosterone levels drawn in the a.m. admitted with signs and symptoms of low testosterone. We discussed various forms of testosterone placement including topical preparations, intramuscular injections, subcutaneous injections, subcutaneous pellet implantation and oral testosterone.  Pros and cons of each form were discussed. The risk of transference of topical testosterone. I had an extensive discussion regarding testosterone replacement therapy including the following: Treatment may result in improvements in erectile function, low sex drive, anemia, bone mineral density, lean body mass, and depressive symptoms; evidence is inconclusive whether testosterone therapy improves cognitive function, measures of diabetes,  energy, fatigue, lipid profiles, and quality of life measures; there is no conclusive evidence linking testosterone therapy to the development of prostate cancer; there is no definitive evidence linking testosterone therapy to a higher incidence of venothrombolic events; at the present time it cannot be stated definitively whether testosterone therapy increases or decreases the risk of cardiovascular events including myocardial infarction and stroke. Potential side effects were  discussed including erythrocytosis, gynecomastia.  The need for regular monitoring of testosterone levels and hematocrit was discussed. He was interested in [place] as TRT option  I have reviewed the above documentation for accuracy and completeness, and I agree with the above.   Riki Altes, MD  [facility] [address] [place], [place] [address] [phone]

## 2022-06-19 ENCOUNTER — Encounter: Payer: Self-pay | Admitting: *Deleted

## 2022-06-19 ENCOUNTER — Other Ambulatory Visit: Payer: Self-pay | Admitting: Urology

## 2022-06-19 LAB — TESTOSTERONE: Testosterone: 221 ng/dL — ABNORMAL LOW (ref 264–916)

## 2022-06-19 LAB — LUTEINIZING HORMONE: LH: 1.9 m[IU]/mL (ref 1.7–8.6)

## 2022-06-19 MED ORDER — XYOSTED 75 MG/0.5ML ~~LOC~~ SOAJ: 75.0000 mg | SUBCUTANEOUS | 3 refills | Status: DC

## 2022-09-24 ENCOUNTER — Other Ambulatory Visit: Payer: Self-pay | Admitting: *Deleted

## 2022-09-27 MED ORDER — XYOSTED 75 MG/0.5ML ~~LOC~~ SOAJ: 75.0000 mg | SUBCUTANEOUS | 3 refills | Status: DC

## 2022-10-23 ENCOUNTER — Other Ambulatory Visit: Payer: Self-pay

## 2022-10-23 DIAGNOSIS — E291 Testicular hypofunction: Secondary | ICD-10-CM

## 2022-10-23 DIAGNOSIS — Z125 Encounter for screening for malignant neoplasm of prostate: Secondary | ICD-10-CM

## 2022-10-26 ENCOUNTER — Other Ambulatory Visit: Payer: BC Managed Care – PPO

## 2022-10-26 DIAGNOSIS — Z125 Encounter for screening for malignant neoplasm of prostate: Secondary | ICD-10-CM | POA: Diagnosis not present

## 2022-10-26 DIAGNOSIS — E291 Testicular hypofunction: Secondary | ICD-10-CM

## 2022-10-27 LAB — PSA: Prostate Specific Ag, Serum: 0.8 ng/mL (ref 0.0–4.0)

## 2022-10-27 LAB — HEMATOCRIT: Hematocrit: 51.1 % — ABNORMAL HIGH (ref 37.5–51.0)

## 2022-10-27 LAB — TESTOSTERONE: Testosterone: 560 ng/dL (ref 264–916)

## 2022-11-11 ENCOUNTER — Ambulatory Visit (INDEPENDENT_AMBULATORY_CARE_PROVIDER_SITE_OTHER): Payer: BC Managed Care – PPO | Admitting: Urology

## 2022-11-11 ENCOUNTER — Encounter: Payer: Self-pay | Admitting: Urology

## 2022-11-11 VITALS — BP 127/89 | HR 69 | Ht 72.0 in | Wt 232.0 lb

## 2022-11-11 DIAGNOSIS — E291 Testicular hypofunction: Secondary | ICD-10-CM | POA: Diagnosis not present

## 2022-11-11 MED ORDER — XYOSTED 100 MG/0.5ML ~~LOC~~ SOAJ: 100.0000 mg | SUBCUTANEOUS | 3 refills | Status: DC

## 2022-11-11 NOTE — Progress Notes (Signed)
   Albin Fischer Abdulla,acting as a scribe for Riki Altes, MD.,have documented all relevant documentation on the behalf of Riki Altes, MD,as directed by  Riki Altes, MD while in the presence of Riki Altes, MD.  11/11/2022 10:57 AM   Jeannie Fend [birth year] [id number]  Referring provider: Lorre Munroe, NP [address],  [place] [address]  Chief Complaint  Patient presents with   Hypogonadism    Urologic History:  1. Hypogonadism -Symptoms: decreased libido, tiredness/fatigue. -Testosterone level low 200 range. -Started on TRT April 2024.   HPI: Scott Joyce is a 42 y.o. male presenting for 6 month follow up of hypogonadism.  Currently on Xyosted 75 mg weekly. Only mild increase in tiredness, fatigue, and libido. No bothersome LUTS. Labs 10/26/22 testosterone 560 ng/dL; PSA 0.8; hematocrit 95.2. Denies dysuria, gross hematuria Denies flank, abdominal or pelvic pain   PMH: Past Medical History:  Diagnosis Date   Esophageal stricture    GERD (gastroesophageal reflux disease)    History of chicken pox     Surgical History: Past Surgical History:  Procedure Laterality Date   ESOPHAGEAL DILATION  2003   HERNIA REPAIR     VASECTOMY      Home Medications:  Allergies as of 11/11/2022   No Known Allergies      Medication List        Accurate as of November 11, 2022 10:57 AM. If you have any questions, ask your nurse or doctor.          STOP taking these medications    Xyosted 75 MG/0.5ML Soaj Generic drug: Testosterone Enanthate Replaced by: Barbaraann Cao 100 MG/0.5ML Soaj Stopped by: Riki Altes       TAKE these medications    atorvastatin 10 MG tablet Commonly known as: LIPITOR Take 1 tablet (10 mg total) by mouth daily.   escitalopram 5 MG tablet Commonly known as: LEXAPRO TAKE 1 TABLET BY MOUTH EVERYDAY AT BEDTIME   Xyosted 100 MG/0.5ML Soaj Generic drug: Testosterone Enanthate Inject 0.5 mLs (100 mg total) into the  skin once a week. Replaces: Xyosted 75 MG/0.5ML Soaj Started by: Riki Altes        Family History: Family History  Problem Relation Age of Onset   Healthy Sister    Breast cancer Paternal Grandmother     Social History:  reports that he has never smoked. He has never used smokeless tobacco. He reports current alcohol use. He reports that he does not use drugs.   Physical Exam: BP 127/89   Pulse 69   Ht 6' (1.829 m)   Wt 232 lb (105.2 kg)   BMI 31.46 kg/m   Constitutional:  Alert and oriented, No acute distress. HEENT: [place] AT Respiratory: Normal respiratory effort, no increased work of breathing. Skin: No rashes, bruises or suspicious lesions. Psychiatric: Normal mood and affect.   Assessment & Plan:    1. Hypogonadism  Testosterone level in good range. Will increased to 100 mg weekly to see if this makes any impact on his symptoms. Hematocrit has increased and recommend he donate blood. 3 month lab visit for H/H and testosterone level.      [facility] [address] [place], [place] [address] [phone]

## 2022-11-12 ENCOUNTER — Encounter: Payer: Self-pay | Admitting: Urology

## 2022-11-25 ENCOUNTER — Other Ambulatory Visit: Payer: Self-pay | Admitting: Internal Medicine

## 2022-11-26 NOTE — Telephone Encounter (Signed)
Requested Prescriptions  Pending Prescriptions Disp Refills   atorvastatin (LIPITOR) 10 MG tablet [Pharmacy Med Name: ATORVASTATIN 10 MG TABLET] 90 tablet 1    Sig: TAKE 1 TABLET BY MOUTH EVERY DAY     Cardiovascular:  Antilipid - Statins Failed - 11/25/2022  2:17 AM      Failed - Lipid Panel in normal range within the last 12 months    Cholesterol  Date Value Ref Range Status  06/02/2022 196 <200 mg/dL Final   LDL Cholesterol (Calc)  Date Value Ref Range Status  06/02/2022 113 (H) mg/dL (calc) Final    Comment:    Reference range: <100 . Desirable range <100 mg/dL for primary prevention;   <70 mg/dL for patients with CHD or diabetic patients  with > or = 2 CHD risk factors. Marland Kitchen LDL-C is now calculated using the Martin-Hopkins  calculation, which is a validated novel method providing  better accuracy than the Friedewald equation in the  estimation of LDL-C.  Horald Pollen et al. Lenox Ahr. 1610;960(45): 2061-2068  (http://education.[facility].com/faq/FAQ164)    Direct LDL  Date Value Ref Range Status  01/02/2019 140.0 mg/dL Final    Comment:    Optimal:  <100 mg/dLNear or Above Optimal:  100-129 mg/dLBorderline High:  130-159 mg/dLHigh:  160-189 mg/dLVery High:  >190 mg/dL   HDL  Date Value Ref Range Status  06/02/2022 56 > OR = 40 mg/dL Final   Triglycerides  Date Value Ref Range Status  06/02/2022 156 (H) <150 mg/dL Final         Passed - Patient is not pregnant      Passed - Valid encounter within last 12 months    Recent Outpatient Visits           6 months ago Encounter for general adult medical examination with abnormal findings   [place] [facility] [place], Salvadore Oxford, NP   1 year ago Encounter for general adult medical examination with abnormal findings   [place] [facility] [place], Salvadore Oxford, NP       Future Appointments             In 6 months Baity, Salvadore Oxford, NP [facility], [facility]

## 2023-02-10 ENCOUNTER — Other Ambulatory Visit: Payer: BC Managed Care – PPO

## 2023-02-10 DIAGNOSIS — E291 Testicular hypofunction: Secondary | ICD-10-CM

## 2023-02-11 LAB — TESTOSTERONE: Testosterone: 674 ng/dL (ref 264–916)

## 2023-02-11 LAB — HEMOGLOBIN AND HEMATOCRIT, BLOOD
Hematocrit: 50.6 % (ref 37.5–51.0)
Hemoglobin: 16.6 g/dL (ref 13.0–17.7)

## 2023-03-12 ENCOUNTER — Other Ambulatory Visit: Payer: Self-pay | Admitting: Urology

## 2023-03-22 ENCOUNTER — Encounter: Payer: Self-pay | Admitting: Urology

## 2023-03-22 ENCOUNTER — Other Ambulatory Visit: Payer: Self-pay | Admitting: *Deleted

## 2023-03-25 MED ORDER — XYOSTED 100 MG/0.5ML ~~LOC~~ SOAJ: 100.0000 mg | SUBCUTANEOUS | 3 refills | Status: DC

## 2023-05-23 ENCOUNTER — Other Ambulatory Visit: Payer: Self-pay | Admitting: Internal Medicine

## 2023-05-23 DIAGNOSIS — F419 Anxiety disorder, unspecified: Secondary | ICD-10-CM

## 2023-05-25 NOTE — Telephone Encounter (Signed)
 Requested Prescriptions  Pending Prescriptions Disp Refills   escitalopram (LEXAPRO) 5 MG tablet [Pharmacy Med Name: ESCITALOPRAM 5 MG TABLET] 90 tablet 0    Sig: TAKE 1 TABLET BY MOUTH EVERYDAY AT BEDTIME     Psychiatry:  Antidepressants - SSRI Failed - 05/25/2023  9:20 AM      Failed - Valid encounter within last 6 months    Recent Outpatient Visits           12 months ago Encounter for general adult medical examination with abnormal findings   [place] [facility] [place], Salvadore Oxford, NP   2 years ago Encounter for general adult medical examination with abnormal findings   [place] [facility] [place], Salvadore Oxford, NP       Future Appointments             In 1 week Sampson Si, Salvadore Oxford, NP [place] [facility]   In 5 months Stoioff, Verna Czech, MD [facility] [place]             atorvastatin (LIPITOR) 10 MG tablet [Pharmacy Med Name: ATORVASTATIN 10 MG TABLET] 90 tablet 0    Sig: TAKE 1 TABLET BY MOUTH EVERY DAY     Cardiovascular:  Antilipid - Statins Failed - 05/25/2023  9:20 AM      Failed - Valid encounter within last 12 months    Recent Outpatient Visits           12 months ago Encounter for general adult medical examination with abnormal findings   [place] [facility] [place], Salvadore Oxford, NP   2 years ago Encounter for general adult medical examination with abnormal findings   [place] [facility] [place], Salvadore Oxford, NP       Future Appointments             In 1 week Sampson Si, Salvadore Oxford, NP [facility], [facility]   In 5 months Lonna Cobb, Verna Czech, MD [facility] [place]            Failed - Lipid Panel in normal range within the last 12 months    Cholesterol  Date Value Ref Range Status  06/02/2022 196 <200 mg/dL Final   LDL Cholesterol (Calc)  Date Value Ref Range Status  06/02/2022 113 (H) mg/dL (calc) Final    Comment:    Reference  range: <100 . Desirable range <100 mg/dL for primary prevention;   <70 mg/dL for patients with CHD or diabetic patients  with > or = 2 CHD risk factors. Marland Kitchen LDL-C is now calculated using the Martin-Hopkins  calculation, which is a validated novel method providing  better accuracy than the Friedewald equation in the  estimation of LDL-C.  Horald Pollen et al. Lenox Ahr. 8413;244(01): 2061-2068  (http://education.[facility].com/faq/FAQ164)    Direct LDL  Date Value Ref Range Status  01/02/2019 140.0 mg/dL Final    Comment:    Optimal:  <100 mg/dLNear or Above Optimal:  100-129 mg/dLBorderline High:  130-159 mg/dLHigh:  160-189 mg/dLVery High:  >190 mg/dL   HDL  Date Value Ref Range Status  06/02/2022 56 > OR = 40 mg/dL Final   Triglycerides  Date Value Ref Range Status  06/02/2022 156 (H) <150 mg/dL Final         Passed - Patient is not pregnant

## 2023-06-04 ENCOUNTER — Encounter: Payer: Self-pay | Admitting: Internal Medicine

## 2023-07-06 ENCOUNTER — Telehealth: Payer: Self-pay | Admitting: *Deleted

## 2023-07-08 ENCOUNTER — Other Ambulatory Visit: Payer: Self-pay | Admitting: Urology

## 2023-07-08 MED ORDER — XYOSTED 100 MG/0.5ML ~~LOC~~ SOAJ: 100.0000 mg | SUBCUTANEOUS | 3 refills | Status: DC

## 2023-07-09 ENCOUNTER — Encounter: Payer: Self-pay | Admitting: Urology

## 2023-07-09 MED ORDER — XYOSTED 100 MG/0.5ML ~~LOC~~ SOAJ: 100.0000 mg | SUBCUTANEOUS | 3 refills | Status: DC

## 2023-07-09 NOTE — Telephone Encounter (Signed)
 Scheduled appt.

## 2023-08-22 ENCOUNTER — Other Ambulatory Visit: Payer: Self-pay | Admitting: Internal Medicine

## 2023-08-22 DIAGNOSIS — F419 Anxiety disorder, unspecified: Secondary | ICD-10-CM

## 2023-08-24 NOTE — Telephone Encounter (Signed)
 Called patient to schedule appt for medication refills/ annual exam. Last CPE 05/29/22. No answer, LVMTCB [phone].

## 2023-08-24 NOTE — Telephone Encounter (Signed)
 Requested medication (s) are due for refill today: yes   Requested medication (s) are on the active medication list: yes   Last refill:  05/25/23 #90 0 refills  Future visit scheduled: no   Notes to clinic:  last OV 05/29/22. Called patient to schedule appt no answer, LVMTCB . Do you want to refill Rxs?     Requested Prescriptions  Pending Prescriptions Disp Refills   escitalopram  (LEXAPRO ) 5 MG tablet [Pharmacy Med Name: ESCITALOPRAM  5 MG TABLET] 90 tablet 0    Sig: TAKE 1 TABLET BY MOUTH EVERYDAY AT BEDTIME     Psychiatry:  Antidepressants - SSRI Failed - 08/24/2023  3:16 PM      Failed - Valid encounter within last 6 months    Recent Outpatient Visits   None     Future Appointments             In 2 months Stoioff, Kizzie Perks, MD [facility] [place]             atorvastatin  (LIPITOR) 10 MG tablet [Pharmacy Med Name: ATORVASTATIN  10 MG TABLET] 90 tablet 0    Sig: TAKE 1 TABLET BY MOUTH EVERY DAY     Cardiovascular:  Antilipid - Statins Failed - 08/24/2023  3:16 PM      Failed - Valid encounter within last 12 months    Recent Outpatient Visits   None     Future Appointments             In 2 months Stoioff, Kizzie Perks, MD [facility] [place]            Failed - Lipid Panel in normal range within the last 12 months    Cholesterol  Date Value Ref Range Status  06/02/2022 196 <200 mg/dL Final   LDL Cholesterol (Calc)  Date Value Ref Range Status  06/02/2022 113 (H) mg/dL (calc) Final    Comment:    Reference range: <100 . Desirable range <100 mg/dL for primary prevention;   <70 mg/dL for patients with CHD or diabetic patients  with > or = 2 CHD risk factors. Aaron Aas LDL-C is now calculated using the Martin-Hopkins  calculation, which is a validated novel method providing  better accuracy than the Friedewald equation in the  estimation of LDL-C.  Melinda Sprawls et al. Erroll Heard. 1610;960(45): 2061-2068   (http://education.[facility].com/faq/FAQ164)    Direct LDL  Date Value Ref Range Status  01/02/2019 140.0 mg/dL Final    Comment:    Optimal:  <100 mg/dLNear or Above Optimal:  100-129 mg/dLBorderline High:  130-159 mg/dLHigh:  160-189 mg/dLVery High:  >190 mg/dL   HDL  Date Value Ref Range Status  06/02/2022 56 > OR = 40 mg/dL Final   Triglycerides  Date Value Ref Range Status  06/02/2022 156 (H) <150 mg/dL Final         Passed - Patient is not pregnant

## 2023-08-26 ENCOUNTER — Encounter: Payer: Self-pay | Admitting: Urology

## 2023-11-04 ENCOUNTER — Other Ambulatory Visit: Payer: Self-pay | Admitting: *Deleted

## 2023-11-04 MED ORDER — XYOSTED 100 MG/0.5ML ~~LOC~~ SOAJ: 100.0000 mg | SUBCUTANEOUS | 3 refills | Status: DC

## 2023-11-11 ENCOUNTER — Other Ambulatory Visit: Payer: Self-pay

## 2023-11-11 DIAGNOSIS — E291 Testicular hypofunction: Secondary | ICD-10-CM

## 2023-11-11 DIAGNOSIS — Z125 Encounter for screening for malignant neoplasm of prostate: Secondary | ICD-10-CM

## 2023-11-12 ENCOUNTER — Other Ambulatory Visit: Payer: Self-pay

## 2023-11-12 DIAGNOSIS — E291 Testicular hypofunction: Secondary | ICD-10-CM

## 2023-11-12 DIAGNOSIS — Z125 Encounter for screening for malignant neoplasm of prostate: Secondary | ICD-10-CM

## 2023-11-13 LAB — TESTOSTERONE: Testosterone: 605 ng/dL (ref 264–916)

## 2023-11-13 LAB — HEMATOCRIT: Hematocrit: 49.7 % (ref 37.5–51.0)

## 2023-11-13 LAB — PSA: Prostate Specific Ag, Serum: 0.9 ng/mL (ref 0.0–4.0)

## 2023-11-16 ENCOUNTER — Ambulatory Visit (INDEPENDENT_AMBULATORY_CARE_PROVIDER_SITE_OTHER): Payer: Self-pay | Admitting: Urology

## 2023-11-16 ENCOUNTER — Encounter: Payer: Self-pay | Admitting: Urology

## 2023-11-16 VITALS — BP 162/86 | HR 79 | Ht 72.0 in | Wt 200.0 lb

## 2023-11-16 DIAGNOSIS — E291 Testicular hypofunction: Secondary | ICD-10-CM | POA: Diagnosis not present

## 2023-11-16 MED ORDER — TESTOSTERONE CYPIONATE 200 MG/ML IM SOLN: 80.0000 mg | INTRAMUSCULAR | 0 refills | Status: DC

## 2023-11-16 NOTE — Progress Notes (Signed)
   11/16/2023 8:18 AM   Scott Joyce 1980/07/06 [id number]  Referring provider: Antonette Angeline ORN, NP [address] [place],  [place] [address]  Chief Complaint  Patient presents with   Hypogonadism   Urologic History:   1. Hypogonadism -Symptoms: decreased libido, tiredness/fatigue. -Testosterone  level low 200 range. -Started on TRT April 2024.  HPI: Scott Joyce is a 43 y.o. male who presents for annual follow-up.  Doing well since last year's visit He was paying $60 per month for Xyosted  however this is gone up to over $200 per month and he desires to switch to intramuscular testosterone  Labs 11/12/2023: Testosterone  605 ng/dL, PSA stable at 0.9, HCT 49.7   PMH: Past Medical History:  Diagnosis Date   Esophageal stricture    GERD (gastroesophageal reflux disease)    History of chicken pox     Surgical History: Past Surgical History:  Procedure Laterality Date   ESOPHAGEAL DILATION  2003   HERNIA REPAIR     VASECTOMY      Home Medications:  Allergies as of 11/16/2023   No Known Allergies      Medication List        Accurate as of November 16, 2023  8:18 AM. If you have any questions, ask your nurse or doctor.          atorvastatin  10 MG tablet Commonly known as: LIPITOR TAKE 1 TABLET BY MOUTH EVERY DAY   escitalopram  5 MG tablet Commonly known as: LEXAPRO  TAKE 1 TABLET BY MOUTH EVERYDAY AT BEDTIME   Xyosted  100 MG/0.5ML Soaj Generic drug: Testosterone  Enanthate Inject 0.5 mLs (100 mg total) into the skin once a week.        Allergies: No Known Allergies  Family History: Family History  Problem Relation Age of Onset   Healthy Sister    Breast cancer Paternal Grandmother     Social History:  reports that he has never smoked. He has never used smokeless tobacco. He reports current alcohol use. He reports that he does not use drugs.   Physical Exam: BP (!) 162/86   Pulse 79   Ht 6' (1.829 m)   Wt 200 lb (90.7 kg)   BMI 27.12 kg/m    Constitutional:  Alert, No acute distress. HEENT: [place] AT Respiratory: Normal respiratory effort, no increased work of breathing. Psychiatric: Normal mood and affect.  Laboratory Data: Lab Results  Component Value Date   TESTOSTERONE  605 11/12/2023    Assessment & Plan:    1.  Hypogonadism Doing well on TRT Will switch to testosterone  cypionate 75 mg weekly Recheck testosterone  level 6 weeks Lab visit 6 months testosterone , H/H Office visit 1 year testosterone , H/H, PSA   Glendia JAYSON Barba, MD  [facility] [address], [address] [place], [place] [address] [phone]

## 2023-11-18 ENCOUNTER — Ambulatory Visit: Payer: Self-pay | Admitting: Urology

## 2023-11-24 ENCOUNTER — Ambulatory Visit (INDEPENDENT_AMBULATORY_CARE_PROVIDER_SITE_OTHER): Admitting: Physician Assistant

## 2023-11-24 VITALS — BP 142/84 | HR 73

## 2023-11-24 DIAGNOSIS — E291 Testicular hypofunction: Secondary | ICD-10-CM | POA: Diagnosis not present

## 2023-11-24 NOTE — Patient Instructions (Signed)
 Supplies needed: -Testosterone  from your pharmacy -Alcohol swabs -3cc Luer Lock syringes -18G Luer Lock needles to draw up the medicine -21G Luer Lock needles to inject the medicine -Bandaids or gauze pads -Optional: Transport planner      Instructions for disposing of sharps:  Disposal of syringes and other sharp objects is monitored by the Dietitian (EPA). It is important to dispose of them properly for your safety and for the safety of others.  The EPA promotes all recycling activities, and therefore encourages you to discard medical waste sharps in sturdy, non-recyclable containers, when possible.  Your stat or community environmental programs may have other requirements or suggestions for disposing of your medical waste.  You should contact your local EPA office for any information you may need.  What container should be used Place needles, syringes, lancets and other sharp objects in a hard plastic or metal container with a screw on or tightly secured lid.  Many containers found in the household will do, or you may purchase containers specifically designed for disposal of medical wast sharps.  If a recyclable container is used to dispose of medical waste sharps, make sure that you don't mix the container with other materials to be recycled.  Since the sharps impair a containers recyclability, a container holding your medical waste sharps properly belongs with the regular household trash.  You should label the container "Not for Recycling".  In addition, make sure your sharps container is made of non breakable material and has a lid that can be securely closed (screwed on or tightly secured).  Before discarding a container, be sure to reinforce the lid with heavy-duty tape.  Do not put sharpe objects in a container you plan to recycle or return to a store, and do not use glass or clear plastic containers (see additional information below).  Finally, make sure that you  keep all containers with sharp objects out of the reach of children and pets.  Your home care provider may deliver a sharps container with your medical supplies.  If so place all needles, syringes and lancets in this container and notify the company when the container is approximately 75% full.  Your home care provider will arrange for pickup of the container.  For your safety, do NOT bring your container to the hospital for disposal.        Tips for minimizing injection pain- -inject medicine that is at room temperature -remove all air bubbles from the syringe before injection -wait until the topical alcohol has evaporated before injecting -keep muscles in the injection area relaxed -break through the skin quickly -don't change the direction of the needle as it goes in or comes out -do not reuse disposable needles

## 2023-11-24 NOTE — Progress Notes (Signed)
 Patient presents today for Testosterone  injection teaching. Patient was instructed on how to properly use the 18guage needle to draw up 1cc of the testosterone , into 3cc syringe then changed the needle to the 21guage for injection. Patient then cleaned the vastus lateralis with an alcohol swab and injected the site with bevel up.   Patient dose:80mg  Lot Number:[id number] Expiration date: 02/2026 Location: Right  Patient verbalized understanding current dose is 0.4ml every 7days unless instructed by a provider.  Patient tolerated well.  Patient understood how to dispose of sharps properly and store medication.   Performed by: Zaira Iacovelli, PA-C   I spent 21 minutes on the day of the encounter to include pre-visit record review, face-to-face time with the patient, and post-visit ordering of tests.

## 2023-11-25 ENCOUNTER — Ambulatory Visit: Payer: BC Managed Care – PPO | Admitting: Urology

## 2023-12-26 ENCOUNTER — Other Ambulatory Visit: Payer: Self-pay | Admitting: Urology

## 2023-12-28 ENCOUNTER — Other Ambulatory Visit

## 2023-12-31 ENCOUNTER — Other Ambulatory Visit

## 2023-12-31 DIAGNOSIS — E291 Testicular hypofunction: Secondary | ICD-10-CM

## 2024-01-01 ENCOUNTER — Ambulatory Visit: Payer: Self-pay | Admitting: Urology

## 2024-01-01 LAB — TESTOSTERONE: Testosterone: 868 ng/dL (ref 264–916)

## 2024-01-01 LAB — PSA: Prostate Specific Ag, Serum: 1 ng/mL (ref 0.0–4.0)

## 2024-01-01 LAB — HEMOGLOBIN AND HEMATOCRIT, BLOOD
Hematocrit: 50.2 % (ref 37.5–51.0)
Hemoglobin: 16.3 g/dL (ref 13.0–17.7)

## 2024-02-22 NOTE — Progress Notes (Unsigned)
 Subjective:    Patient ID: Scott Joyce, male    DOB: Aug 22, 1980, 43 y.o.   MRN: [id number]  HPI  Patient presents to clinic today for his annual exam.  Flu: 12/2020 Tetanus: 04/2016 COVID: Pfizer x 2 Colon screening: Never Vision screening: annually Dentist: biannually  Diet: He does eat meat. He consumes fruits and veggies. He does eat some fried foods. He drinks mostly water, energy drinks Exercise: 2-3 gym, mainly lifting  Review of Systems     Past Medical History:  Diagnosis Date   Esophageal stricture    GERD (gastroesophageal reflux disease)    History of chicken pox     Current Outpatient Medications  Medication Sig Dispense Refill   atorvastatin  (LIPITOR) 10 MG tablet TAKE 1 TABLET BY MOUTH EVERY DAY 90 tablet 0   escitalopram  (LEXAPRO ) 5 MG tablet TAKE 1 TABLET BY MOUTH EVERYDAY AT BEDTIME 90 tablet 0   testosterone  cypionate (DEPOTESTOSTERONE CYPIONATE) 200 MG/ML injection INJECT 0.4 MLS (80 MG TOTAL) INTO THE MUSCLE ONCE A WEEK. *SINGLE USE VIAL DISCARD AFTER PUNCTURE 4 mL 1   No current facility-administered medications for this visit.    No Known Allergies  Family History  Problem Relation Age of Onset   Healthy Sister    Breast cancer Paternal Grandmother     Social History   Socioeconomic History   Marital status: Married    Spouse name: Not on file   Number of children: Not on file   Years of education: Not on file   Highest education level: Not on file  Occupational History   Not on file  Tobacco Use   Smoking status: Never   Smokeless tobacco: Never  Vaping Use   Vaping status: Never Used  Substance and Sexual Activity   Alcohol use: Yes    Comment: occasional   Drug use: No   Sexual activity: Yes  Other Topics Concern   Not on file  Social History Narrative   Not on file   Social Drivers of Health   Tobacco Use: Low Risk (11/16/2023)   Patient History    Smoking Tobacco Use: Never    Smokeless Tobacco Use: Never     Passive Exposure: Not on file  Financial Resource Strain: Not on file  Food Insecurity: Not on file  Transportation Needs: Not on file  Physical Activity: Not on file  Stress: Not on file  Social Connections: Not on file  Intimate Partner Violence: Not on file  Depression (PHQ2-9): Low Risk (05/29/2022)   Depression (PHQ2-9)    PHQ-2 Score: 0  Alcohol Screen: Low Risk (05/29/2022)   Alcohol Screen    Last Alcohol Screening Score (AUDIT): 2  Housing: Not on file  Utilities: Not on file  Health Literacy: Not on file     Constitutional: Denies fever, malaise, fatigue, headache or abrupt weight changes.  HEENT: Denies eye pain, eye redness, ear pain, ringing in the ears, wax buildup, runny nose, nasal congestion, bloody nose, or sore throat. Respiratory: Denies difficulty breathing, shortness of breath, cough or sputum production.   Cardiovascular: Denies chest pain, chest tightness, palpitations or swelling in the hands or feet.  Gastrointestinal: Denies abdominal pain, bloating, constipation, diarrhea or blood in the stool.  GU: Patient reports erectile dysfunction.  Denies urgency, frequency, pain with urination, burning sensation, blood in urine, odor or discharge. Musculoskeletal: Denies decrease in range of motion, difficulty with gait, muscle pain or joint pain and swelling.  Skin: Denies redness, rashes, lesions or ulcercations.  Neurological: Denies dizziness, difficulty with memory, difficulty with speech or problems with balance and coordination.  Psych: Patient has a history of anxiety.  Denies depression, SI/HI.  No other specific complaints in a complete review of systems (except as listed in HPI above).  Objective:   Physical Exam   There were no vitals taken for this visit.  Wt Readings from Last 3 Encounters:  11/16/23 200 lb (90.7 kg)  11/11/22 232 lb (105.2 kg)  06/18/22 223 lb (101.2 kg)    General: Appears his stated age, obese, in NAD. Skin: Warm, dry and  intact. No rashes noted. HEENT: Head: normal shape and size; Eyes: sclera white, no icterus, conjunctiva pink, PERRLA and EOMs intact;  Neck:  Neck supple, trachea midline. No masses, lumps or thyromegaly present.  Cardiovascular: Normal rate and rhythm. S1,S2 noted.  No murmur, rubs or gallops noted. No JVD or BLE edema.  Pulmonary/Chest: Normal effort and positive vesicular breath sounds. No respiratory distress. No wheezes, rales or ronchi noted.  Abdomen: Soft and nontender. Normal bowel sounds.  Musculoskeletal: Strength 5/5 BUE/BLE. No difficulty with gait.  Neurological: Alert and oriented. Cranial nerves II-XII grossly intact. Coordination normal.  Psychiatric: Mood and affect normal. Behavior is normal. Judgment and thought content normal.    BMET    Component Value Date/Time   NA 137 06/02/2022 0827   K 4.2 06/02/2022 0827   CL 100 06/02/2022 0827   CO2 26 06/02/2022 0827   GLUCOSE 92 06/02/2022 0827   BUN 23 06/02/2022 0827   CREATININE 0.98 06/02/2022 0827   CALCIUM  9.5 06/02/2022 0827    Lipid Panel     Component Value Date/Time   CHOL 196 06/02/2022 0827   TRIG 156 (H) 06/02/2022 0827   HDL 56 06/02/2022 0827   CHOLHDL 3.5 06/02/2022 0827   VLDL 43.6 (H) 01/02/2019 1159   LDLCALC 113 (H) 06/02/2022 0827    CBC    Component Value Date/Time   WBC 5.8 06/02/2022 0827   RBC 5.33 06/02/2022 0827   HGB 16.3 12/31/2023 0809   HCT 50.2 12/31/2023 0809   PLT 228 06/02/2022 0827   PLT 169 08/09/2011 0935   MCV 85.4 06/02/2022 0827   MCV 87 08/09/2011 0935   MCH 28.1 06/02/2022 0827   MCHC 33.0 06/02/2022 0827   RDW 13.2 06/02/2022 0827   RDW 13.0 08/09/2011 0935   LYMPHSABS 0.6 (L) 08/09/2011 0935   MONOABS 0.7 08/09/2011 0935   EOSABS 0.0 08/09/2011 0935   BASOSABS 0.0 08/09/2011 0935    Hgb A1C Lab Results  Component Value Date   HGBA1C 5.8 (H) 06/02/2022          Assessment & Plan:   Preventative Health Maintenance:  Flu shot Tetanus  UTD Encouraged him to get his COVID booster Referral to GI for screening colonoscopy Encouraged him to consume a balanced diet and exercise regimen Advised him to see an eye doctor and dentist annually We will check CBC, c-Met, lipid, A1c today  RTC in 6 months, follow-up chronic conditions Angeline Laura, NP

## 2024-02-23 ENCOUNTER — Ambulatory Visit: Admitting: Internal Medicine

## 2024-02-23 ENCOUNTER — Encounter: Payer: Self-pay | Admitting: Internal Medicine

## 2024-02-23 VITALS — BP 140/84 | Ht 72.0 in | Wt 203.2 lb

## 2024-02-23 DIAGNOSIS — E663 Overweight: Secondary | ICD-10-CM

## 2024-02-23 DIAGNOSIS — R7303 Prediabetes: Secondary | ICD-10-CM

## 2024-02-23 DIAGNOSIS — Z1211 Encounter for screening for malignant neoplasm of colon: Secondary | ICD-10-CM

## 2024-02-23 DIAGNOSIS — Z0001 Encounter for general adult medical examination with abnormal findings: Secondary | ICD-10-CM | POA: Diagnosis not present

## 2024-02-23 DIAGNOSIS — Z23 Encounter for immunization: Secondary | ICD-10-CM | POA: Diagnosis not present

## 2024-02-23 DIAGNOSIS — Z6827 Body mass index (BMI) 27.0-27.9, adult: Secondary | ICD-10-CM | POA: Diagnosis not present

## 2024-02-23 DIAGNOSIS — M25552 Pain in left hip: Secondary | ICD-10-CM

## 2024-02-23 DIAGNOSIS — M545 Low back pain, unspecified: Secondary | ICD-10-CM

## 2024-02-23 DIAGNOSIS — E781 Pure hyperglyceridemia: Secondary | ICD-10-CM | POA: Diagnosis not present

## 2024-02-23 DIAGNOSIS — I158 Other secondary hypertension: Secondary | ICD-10-CM | POA: Insufficient documentation

## 2024-02-23 MED ORDER — PREDNISONE 10 MG PO TABS: ORAL_TABLET | ORAL | 0 refills | Status: DC

## 2024-02-23 MED ORDER — ATORVASTATIN CALCIUM 10 MG PO TABS: 10.0000 mg | ORAL_TABLET | Freq: Every day | ORAL | 1 refills | Status: AC

## 2024-02-23 MED ORDER — ESCITALOPRAM OXALATE 5 MG PO TABS: ORAL_TABLET | ORAL | 1 refills | Status: AC

## 2024-02-23 MED ORDER — TIZANIDINE HCL 4 MG PO TABS: 4.0000 mg | ORAL_TABLET | Freq: Every day | ORAL | 0 refills | Status: DC

## 2024-02-23 NOTE — Assessment & Plan Note (Signed)
 Encourage diet and exercise for weight loss

## 2024-02-23 NOTE — Patient Instructions (Signed)
 Health Maintenance, Male  Adopting a healthy lifestyle and getting preventive care are important in promoting health and wellness. Ask your health care provider about:  The right schedule for you to have regular tests and exams.  Things you can do on your own to prevent diseases and keep yourself healthy.  What should I know about diet, weight, and exercise?  Eat a healthy diet    Eat a diet that includes plenty of vegetables, fruits, low-fat dairy products, and lean protein.  Do not eat a lot of foods that are high in solid fats, added sugars, or sodium.  Maintain a healthy weight  Body mass index (BMI) is a measurement that can be used to identify possible weight problems. It estimates body fat based on height and weight. Your health care provider can help determine your BMI and help you achieve or maintain a healthy weight.  Get regular exercise  Get regular exercise. This is one of the most important things you can do for your health. Most adults should:  Exercise for at least 150 minutes each week. The exercise should increase your heart rate and make you sweat (moderate-intensity exercise).  Do strengthening exercises at least twice a week. This is in addition to the moderate-intensity exercise.  Spend less time sitting. Even light physical activity can be beneficial.  Watch cholesterol and blood lipids  Have your blood tested for lipids and cholesterol at 45 years of age, then have this test every 5 years.  You may need to have your cholesterol levels checked more often if:  Your lipid or cholesterol levels are high.  You are older than 45 years of age.  You are at high risk for heart disease.  What should I know about cancer screening?  Many types of cancers can be detected early and may often be prevented. Depending on your health history and family history, you may need to have cancer screening at various ages. This may include screening for:  Colorectal cancer.  Prostate cancer.  Skin cancer.  Lung  cancer.  What should I know about heart disease, diabetes, and high blood pressure?  Blood pressure and heart disease  High blood pressure causes heart disease and increases the risk of stroke. This is more likely to develop in people who have high blood pressure readings or are overweight.  Talk with your health care provider about your target blood pressure readings.  Have your blood pressure checked:  Every 3-5 years if you are 24-52 years of age.  Every year if you are 3 years old or older.  If you are between the ages of 60 and 72 and are a current or former smoker, ask your health care provider if you should have a one-time screening for abdominal aortic aneurysm (AAA).  Diabetes  Have regular diabetes screenings. This checks your fasting blood sugar level. Have the screening done:  Once every three years after age 66 if you are at a normal weight and have a low risk for diabetes.  More often and at a younger age if you are overweight or have a high risk for diabetes.  What should I know about preventing infection?  Hepatitis B  If you have a higher risk for hepatitis B, you should be screened for this virus. Talk with your health care provider to find out if you are at risk for hepatitis B infection.  Hepatitis C  Blood testing is recommended for:  Everyone born from [birth year] through 1967.  Anyone  with known risk factors for hepatitis C.  Sexually transmitted infections (STIs)  You should be screened each year for STIs, including gonorrhea and chlamydia, if:  You are sexually active and are younger than 45 years of age.  You are older than 45 years of age and your health care provider tells you that you are at risk for this type of infection.  Your sexual activity has changed since you were last screened, and you are at increased risk for chlamydia or gonorrhea. Ask your health care provider if you are at risk.  Ask your health care provider about whether you are at high risk for HIV. Your health care provider  may recommend a prescription medicine to help prevent HIV infection. If you choose to take medicine to prevent HIV, you should first get tested for HIV. You should then be tested every 3 months for as long as you are taking the medicine.  Follow these instructions at home:  Alcohol use  Do not drink alcohol if your health care provider tells you not to drink.  If you drink alcohol:  Limit how much you have to 0-2 drinks a day.  Know how much alcohol is in your drink. In the [place] one drink equals one 12 oz bottle of beer (355 mL), one 5 oz glass of wine (148 mL), or one 1 oz glass of hard liquor (44 mL).  Lifestyle  Do not use any products that contain nicotine or tobacco. These products include cigarettes, chewing tobacco, and vaping devices, such as e-cigarettes. If you need help quitting, ask your health care provider.  Do not use street drugs.  Do not share needles.  Ask your health care provider for help if you need support or information about quitting drugs.  General instructions  Schedule regular health, dental, and eye exams.  Stay current with your vaccines.  Tell your health care provider if:  You often feel depressed.  You have ever been abused or do not feel safe at home.  Summary  Adopting a healthy lifestyle and getting preventive care are important in promoting health and wellness.  Follow your health care provider's instructions about healthy diet, exercising, and getting tested or screened for diseases.  Follow your health care provider's instructions on monitoring your cholesterol and blood pressure.  This information is not intended to replace advice given to you by your health care provider. Make sure you discuss any questions you have with your health care provider.  Document Revised: 07/15/2020 Document Reviewed: 07/15/2020  Elsevier Patient Education  2024 [employer].

## 2024-02-23 NOTE — Assessment & Plan Note (Signed)
 Likely related to testosterone  use Will start olmesartan 20 mg daily Reinforced DASH diet and exercise for weight loss C-Met today

## 2024-02-24 ENCOUNTER — Ambulatory Visit: Payer: Self-pay | Admitting: Internal Medicine

## 2024-02-24 LAB — COMPREHENSIVE METABOLIC PANEL WITH GFR
AG Ratio: 2 (calc) (ref 1.0–2.5)
ALT: 18 U/L (ref 9–46)
AST: 16 U/L (ref 10–40)
Albumin: 4.7 g/dL (ref 3.6–5.1)
Alkaline phosphatase (APISO): 51 U/L (ref 36–130)
BUN: 14 mg/dL (ref 7–25)
CO2: 27 mmol/L (ref 20–32)
Calcium: 9.3 mg/dL (ref 8.6–10.3)
Chloride: 102 mmol/L (ref 98–110)
Creat: 1.09 mg/dL (ref 0.60–1.29)
Globulin: 2.4 g/dL (ref 1.9–3.7)
Glucose, Bld: 93 mg/dL (ref 65–99)
Potassium: 4.4 mmol/L (ref 3.5–5.3)
Sodium: 138 mmol/L (ref 135–146)
Total Bilirubin: 0.9 mg/dL (ref 0.2–1.2)
Total Protein: 7.1 g/dL (ref 6.1–8.1)
eGFR: 85 mL/min/1.73m2 (ref >=60)

## 2024-02-24 LAB — CBC
HCT: 50.1 % (ref 39.4–51.1)
Hemoglobin: 16.2 g/dL (ref 13.2–17.1)
MCH: 27.4 pg (ref 27.0–33.0)
MCHC: 32.3 g/dL (ref 31.6–35.4)
MCV: 84.6 fL (ref 81.4–101.7)
MPV: 11.1 fL (ref 7.5–12.5)
Platelets: 249 Thousand/uL (ref 140–400)
RBC: 5.92 Million/uL — ABNORMAL HIGH (ref 4.20–5.80)
RDW: 14 % (ref 11.0–15.0)
WBC: 5.4 Thousand/uL (ref 3.8–10.8)

## 2024-02-24 LAB — LIPID PANEL
Cholesterol: 145 mg/dL (ref <200)
HDL: 58 mg/dL (ref >=40)
LDL Cholesterol (Calc): 69 mg/dL
Non-HDL Cholesterol (Calc): 87 mg/dL (ref <130)
Total CHOL/HDL Ratio: 2.5 (calc) (ref <5.0)
Triglycerides: 101 mg/dL (ref <150)

## 2024-02-24 LAB — HEMOGLOBIN A1C
Hgb A1c MFr Bld: 5.5 % (ref <5.7)
Mean Plasma Glucose: 111 mg/dL
eAG (mmol/L): 6.2 mmol/L

## 2024-03-15 LAB — COLOGUARD: COLOGUARD: NEGATIVE

## 2024-03-16 ENCOUNTER — Other Ambulatory Visit: Payer: Self-pay | Admitting: Internal Medicine

## 2024-03-16 NOTE — Telephone Encounter (Signed)
 Requested medications are due for refill today.  A little too soon  Requested medications are on the active medications list.  yes  Last refill. 02/23/2024 #30 0 rf  Future visit scheduled.   yes  Notes to clinic.  Refill not delegated.    Requested Prescriptions  Pending Prescriptions Disp Refills   tiZANidine  (ZANAFLEX ) 4 MG tablet [Pharmacy Med Name: TIZANIDINE  HCL 4 MG TABLET] 90 tablet 1    Sig: TAKE 1 TABLET BY MOUTH AT BEDTIME.     Not Delegated - Cardiovascular:  Alpha-2 Agonists - tizanidine  Failed - 03/16/2024  5:56 PM      Failed - This refill cannot be delegated      Passed - Valid encounter within last 6 months    Recent Outpatient Visits           3 weeks ago Encounter for general adult medical examination with abnormal findings   [facility] Hardy, Angeline ORN, NP       Future Appointments             In 8 months Stoioff, Glendia BROCKS, MD [facility]

## 2024-03-17 ENCOUNTER — Ambulatory Visit: Admitting: Internal Medicine

## 2024-03-23 ENCOUNTER — Ambulatory Visit: Admitting: Internal Medicine

## 2024-03-23 ENCOUNTER — Encounter: Payer: Self-pay | Admitting: Internal Medicine

## 2024-03-23 VITALS — BP 122/86 | Ht 72.0 in | Wt 206.0 lb

## 2024-03-23 DIAGNOSIS — I158 Other secondary hypertension: Secondary | ICD-10-CM | POA: Diagnosis not present

## 2024-03-23 DIAGNOSIS — Z6827 Body mass index (BMI) 27.0-27.9, adult: Secondary | ICD-10-CM

## 2024-03-23 DIAGNOSIS — E663 Overweight: Secondary | ICD-10-CM

## 2024-03-23 NOTE — Progress Notes (Signed)
 "  Subjective:    Patient ID: Scott Joyce, male    DOB: [birth year], 44 y.o.   MRN: [id number]  HPI  Patient dents to clinic today for 2-week follow-up of HTN.  At his last visit, he was started on olmesartan 20 mg daily.  He has been taking medication as prescribed.  He denies adverse side effects.  His BP today is 122/86.  Review of Systems     Past Medical History:  Diagnosis Date   Anxiety 2021   Esophageal stricture    GERD (gastroesophageal reflux disease)    History of chicken pox     Current Outpatient Medications  Medication Sig Dispense Refill   atorvastatin  (LIPITOR) 10 MG tablet Take 1 tablet (10 mg total) by mouth daily. 90 tablet 1   escitalopram  (LEXAPRO ) 5 MG tablet TAKE 1 TABLET BY MOUTH EVERYDAY AT BEDTIME 90 tablet 1   olmesartan (BENICAR) 20 MG tablet Take 1 tablet (20 mg total) by mouth daily. 90 tablet 1   predniSONE  (DELTASONE ) 10 MG tablet Take 3 tabs on days 1-3, 2 tabs on days 4-6, 1 tab on days 7-9 18 tablet 0   testosterone  cypionate (DEPOTESTOSTERONE CYPIONATE) 200 MG/ML injection INJECT 0.4 MLS (80 MG TOTAL) INTO THE MUSCLE ONCE A WEEK. *SINGLE USE VIAL DISCARD AFTER PUNCTURE 4 mL 1   tiZANidine  (ZANAFLEX ) 4 MG tablet Take 1 tablet (4 mg total) by mouth at bedtime. 30 tablet 0   No current facility-administered medications for this visit.    No Known Allergies  Family History  Problem Relation Age of Onset   Healthy Sister    Breast cancer Paternal Grandmother     Social History   Socioeconomic History   Marital status: Married    Spouse name: Not on file   Number of children: Not on file   Years of education: Not on file   Highest education level: Not on file  Occupational History   Not on file  Tobacco Use   Smoking status: Never   Smokeless tobacco: Never  Vaping Use   Vaping status: Never Used  Substance and Sexual Activity   Alcohol use: Not Currently    Comment: occasional   Drug use: Never   Sexual activity: Yes  Other  Topics Concern   Not on file  Social History Narrative   Not on file   Social Drivers of Health   Tobacco Use: Low Risk (02/23/2024)   Patient History    Smoking Tobacco Use: Never    Smokeless Tobacco Use: Never    Passive Exposure: Not on file  Financial Resource Strain: Not on file  Food Insecurity: Not on file  Transportation Needs: Not on file  Physical Activity: Insufficiently Active (02/23/2024)   Exercise Vital Sign    Days of Exercise per Week: 2 days    Minutes of Exercise per Session: 30 min  Stress: No Stress Concern Present (02/23/2024)   [employer] of Occupational Health - Occupational Stress Questionnaire    Feeling of Stress: Only a little  Social Connections: Unknown (02/23/2024)   Social Connection and Isolation Panel    Frequency of Communication with Friends and Family: Not on file    Frequency of Social Gatherings with Friends and Family: Not on file    Attends Religious Services: Not on file    Active Member of Clubs or Organizations: No    Attends Banker Meetings: Not on file    Marital Status: Married  Intimate Partner Violence:  Not on file  Depression (PHQ2-9): Low Risk (02/23/2024)   Depression (PHQ2-9)    PHQ-2 Score: 1  Alcohol Screen: Low Risk (05/29/2022)   Alcohol Screen    Last Alcohol Screening Score (AUDIT): 2  Housing: Not on file  Utilities: Not on file  Health Literacy: Not on file     Constitutional: Denies fever, malaise, fatigue, headache or abrupt weight changes.  HEENT: Denies eye pain, eye redness, ear pain, ringing in the ears, wax buildup, runny nose, nasal congestion, bloody nose, or sore throat. Respiratory: Denies difficulty breathing, shortness of breath, cough or sputum production.   Cardiovascular: Denies chest pain, chest tightness, palpitations or swelling in the hands or feet.  Gastrointestinal: Denies abdominal pain, bloating, constipation, diarrhea or blood in the stool.  GU: Patient reports  erectile dysfunction.  Denies urgency, frequency, pain with urination, burning sensation, blood in urine, odor or discharge. Musculoskeletal: Denies decrease in range of motion, difficulty with gait, muscle pain or joint swelling.  Skin: Denies redness, rashes, lesions or ulcercations.  Neurological: Denies dizziness, difficulty with memory, difficulty with speech or problems with balance and coordination.  Psych: Patient has a history of anxiety.  Denies depression, SI/HI.  No other specific complaints in a complete review of systems (except as listed in HPI above).  Objective:   Physical Exam BP 122/86 (BP Location: Left Arm, Patient Position: Sitting, Cuff Size: Large)   Ht 6' (1.829 m)   Wt 206 lb (93.4 kg)   BMI 27.94 kg/m     Wt Readings from Last 3 Encounters:  02/23/24 203 lb 3.2 oz (92.2 kg)  11/16/23 200 lb (90.7 kg)  11/11/22 232 lb (105.2 kg)    General: Appears his stated age, overweight, in NAD. HEENT: Head: normal shape and size; Eyes: sclera white, no icterus, conjunctiva pink, PERRLA and EOMs intact;  Cardiovascular: Normal rate and rhythm. S1,S2 noted.  No murmur, rubs or gallops noted. No JVD or BLE edema.  Pulmonary/Chest: Normal effort and positive vesicular breath sounds. No respiratory distress. No wheezes, rales or ronchi noted.  Neurological: Alert and oriented.  Coordination normal.    BMET    Component Value Date/Time   NA 138 02/23/2024 0914   K 4.4 02/23/2024 0914   CL 102 02/23/2024 0914   CO2 27 02/23/2024 0914   GLUCOSE 93 02/23/2024 0914   BUN 14 02/23/2024 0914   CREATININE 1.09 02/23/2024 0914   CALCIUM  9.3 02/23/2024 0914    Lipid Panel     Component Value Date/Time   CHOL 145 02/23/2024 0914   TRIG 101 02/23/2024 0914   HDL 58 02/23/2024 0914   CHOLHDL 2.5 02/23/2024 0914   VLDL 43.6 (H) 01/02/2019 1159   LDLCALC 69 02/23/2024 0914    CBC    Component Value Date/Time   WBC 5.4 02/23/2024 0914   RBC 5.92 (H) 02/23/2024  0914   HGB 16.2 02/23/2024 0914   HGB 16.3 12/31/2023 0809   HCT 50.1 02/23/2024 0914   HCT 50.2 12/31/2023 0809   PLT 249 02/23/2024 0914   PLT 169 08/09/2011 0935   MCV 84.6 02/23/2024 0914   MCV 87 08/09/2011 0935   MCH 27.4 02/23/2024 0914   MCHC 32.3 02/23/2024 0914   RDW 14.0 02/23/2024 0914   RDW 13.0 08/09/2011 0935   LYMPHSABS 0.6 (L) 08/09/2011 0935   MONOABS 0.7 08/09/2011 0935   EOSABS 0.0 08/09/2011 0935   BASOSABS 0.0 08/09/2011 0935    Hgb A1C Lab Results  Component Value  Date   HGBA1C 5.5 02/23/2024          Assessment & Plan:     RTC in 5 months, follow-up chronic conditions Angeline Laura, NP  "

## 2024-03-23 NOTE — Patient Instructions (Signed)
 Hypertension, Adult Hypertension is another name for high blood pressure. High blood pressure forces your heart to work harder to pump blood. This can cause problems over time. There are two numbers in a blood pressure reading. There is a top number (systolic) over a bottom number (diastolic). It is best to have a blood pressure that is below 120/80. What are the causes? The cause of this condition is not known. Some other conditions can lead to high blood pressure. What increases the risk? Some lifestyle factors can make you more likely to develop high blood pressure: Smoking. Not getting enough exercise or physical activity. Being overweight. Having too much fat, sugar, calories, or salt (sodium) in your diet. Drinking too much alcohol. Other risk factors include: Having any of these conditions: Heart disease. Diabetes. High cholesterol. Kidney disease. Obstructive sleep apnea. Having a family history of high blood pressure and high cholesterol. Age. The risk increases with age. Stress. What are the signs or symptoms? High blood pressure may not cause symptoms. Very high blood pressure (hypertensive crisis) may cause: Headache. Fast or uneven heartbeats (palpitations). Shortness of breath. Nosebleed. Vomiting or feeling like you may vomit (nauseous). Changes in how you see. Very bad chest pain. Feeling dizzy. Seizures. How is this treated? This condition is treated by making healthy lifestyle changes, such as: Eating healthy foods. Exercising more. Drinking less alcohol. Your doctor may prescribe medicine if lifestyle changes do not help enough and if: Your top number is above 130. Your bottom number is above 80. Your personal target blood pressure may vary. Follow these instructions at home: Eating and drinking  If told, follow the DASH eating plan. To follow this plan: Fill one half of your plate at each meal with fruits and vegetables. Fill one fourth of your plate  at each meal with whole grains. Whole grains include whole-wheat pasta, brown rice, and whole-grain bread. Eat or drink low-fat dairy products, such as skim milk or low-fat yogurt. Fill one fourth of your plate at each meal with low-fat (lean) proteins. Low-fat proteins include fish, chicken without skin, eggs, beans, and tofu. Avoid fatty meat, cured and processed meat, or chicken with skin. Avoid pre-made or processed food. Limit the amount of salt in your diet to less than 1,500 mg each day. Do not drink alcohol if: Your doctor tells you not to drink. You are pregnant, may be pregnant, or are planning to become pregnant. If you drink alcohol: Limit how much you have to: 0-1 drink a day for women. 0-2 drinks a day for men. Know how much alcohol is in your drink. In the [place] one drink equals one 12 oz bottle of beer (355 mL), one 5 oz glass of wine (148 mL), or one 1 oz glass of hard liquor (44 mL). Lifestyle  Work with your doctor to stay at a healthy weight or to lose weight. Ask your doctor what the best weight is for you. Get at least 30 minutes of exercise that causes your heart to beat faster (aerobic exercise) most days of the week. This may include walking, swimming, or biking. Get at least 30 minutes of exercise that strengthens your muscles (resistance exercise) at least 3 days a week. This may include lifting weights or doing Pilates. Do not smoke or use any products that contain nicotine or tobacco. If you need help quitting, ask your doctor. Check your blood pressure at home as told by your doctor. Keep all follow-up visits. Medicines Take over-the-counter and prescription medicines  only as told by your doctor. Follow directions carefully. Do not skip doses of blood pressure medicine. The medicine does not work as well if you skip doses. Skipping doses also puts you at risk for problems. Ask your doctor about side effects or reactions to medicines that you should watch  for. Contact a doctor if: You think you are having a reaction to the medicine you are taking. You have headaches that keep coming back. You feel dizzy. You have swelling in your ankles. You have trouble with your vision. Get help right away if: You get a very bad headache. You start to feel mixed up (confused). You feel weak or numb. You feel faint. You have very bad pain in your: Chest. Belly (abdomen). You vomit more than once. You have trouble breathing. These symptoms may be an emergency. Get help right away. Call 911. Do not wait to see if the symptoms will go away. Do not drive yourself to the hospital. Summary Hypertension is another name for high blood pressure. High blood pressure forces your heart to work harder to pump blood. For most people, a normal blood pressure is less than 120/80. Making healthy choices can help lower blood pressure. If your blood pressure does not get lower with healthy choices, you may need to take medicine. This information is not intended to replace advice given to you by your health care provider. Make sure you discuss any questions you have with your health care provider. Document Revised: 12/12/2020 Document Reviewed: 12/12/2020 Elsevier Patient Education  2024 [employer].

## 2024-03-23 NOTE — Assessment & Plan Note (Signed)
 Controlled on olmesartan 20 mg daily Reinforced DASH diet and exercise for weight loss Kidney function reviewed

## 2024-03-23 NOTE — Assessment & Plan Note (Signed)
 Encourage diet and exercise for weight loss

## 2024-03-28 ENCOUNTER — Other Ambulatory Visit: Payer: Self-pay | Admitting: Urology

## 2024-04-08 ENCOUNTER — Other Ambulatory Visit: Payer: Self-pay | Admitting: Internal Medicine

## 2024-04-11 NOTE — Telephone Encounter (Signed)
 Requested medications are due for refill today.  yes  Requested medications are on the active medications list.  yes  Last refill. 02/23/2024 #30 0 rf  Future visit scheduled.   yes  Notes to clinic.  Refill not delegated.     Requested Prescriptions  Pending Prescriptions Disp Refills   tiZANidine  (ZANAFLEX ) 4 MG tablet [Pharmacy Med Name: TIZANIDINE  HCL 4 MG TABLET] 30 tablet 0    Sig: TAKE 1 TABLET BY MOUTH AT BEDTIME.     Not Delegated - Cardiovascular:  Alpha-2 Agonists - tizanidine  Failed - 04/11/2024  7:46 AM      Failed - This refill cannot be delegated      Passed - Valid encounter within last 6 months    Recent Outpatient Visits           2 weeks ago Other secondary hypertension   [place] [facility] [place], Angeline ORN, NP   1 month ago Encounter for general adult medical examination with abnormal findings   [place] [facility], Angeline ORN, NP       Future Appointments             In 7 months Stoioff, Glendia BROCKS, MD [facility]

## 2024-05-15 ENCOUNTER — Other Ambulatory Visit

## 2024-08-22 ENCOUNTER — Ambulatory Visit: Admitting: Internal Medicine

## 2024-11-15 ENCOUNTER — Other Ambulatory Visit

## 2024-11-17 ENCOUNTER — Ambulatory Visit: Admitting: Urology
